# Patient Record
Sex: Female | Born: 1955
Health system: Southern US, Community
[De-identification: ages and names within clinical notes are randomized; demographics above are authoritative.]

## PROBLEM LIST (undated history)

## (undated) DIAGNOSIS — D699 Hemorrhagic condition, unspecified: Secondary | ICD-10-CM

## (undated) DIAGNOSIS — G5600 Carpal tunnel syndrome, unspecified upper limb: Secondary | ICD-10-CM

## (undated) DIAGNOSIS — N189 Chronic kidney disease, unspecified: Secondary | ICD-10-CM

## (undated) DIAGNOSIS — T7840XA Allergy, unspecified, initial encounter: Secondary | ICD-10-CM

## (undated) DIAGNOSIS — M1990 Unspecified osteoarthritis, unspecified site: Secondary | ICD-10-CM

## (undated) DIAGNOSIS — Z8669 Personal history of other diseases of the nervous system and sense organs: Secondary | ICD-10-CM

## (undated) DIAGNOSIS — R011 Cardiac murmur, unspecified: Secondary | ICD-10-CM

## (undated) DIAGNOSIS — N390 Urinary tract infection, site not specified: Secondary | ICD-10-CM

## (undated) DIAGNOSIS — I341 Nonrheumatic mitral (valve) prolapse: Secondary | ICD-10-CM

## (undated) DIAGNOSIS — G5601 Carpal tunnel syndrome, right upper limb: Principal | ICD-10-CM

## (undated) HISTORY — DX: Allergy, unspecified, initial encounter: T78.40XA

## (undated) HISTORY — DX: Carpal tunnel syndrome, right upper limb: G56.01

## (undated) HISTORY — DX: Carpal tunnel syndrome, unspecified upper limb: G56.00

## (undated) HISTORY — PX: COLONOSCOPY: SHX174

## (undated) HISTORY — DX: Cardiac murmur, unspecified: R01.1

## (undated) HISTORY — PX: EYE SURGERY: SHX253

## (undated) HISTORY — DX: Unspecified osteoarthritis, unspecified site: M19.90

## (undated) HISTORY — DX: Chronic kidney disease, unspecified: N18.9

---

## 1984-01-07 DIAGNOSIS — Z5189 Encounter for other specified aftercare: Secondary | ICD-10-CM

## 1984-01-07 HISTORY — DX: Encounter for other specified aftercare: Z51.89

## 1999-08-30 ENCOUNTER — Encounter: Admission: RE | Admit: 1999-08-30 | Discharge: 1999-08-30 | Payer: Self-pay | Admitting: Family Medicine

## 1999-08-30 ENCOUNTER — Encounter: Payer: Self-pay | Admitting: Family Medicine

## 1999-10-23 ENCOUNTER — Encounter: Admission: RE | Admit: 1999-10-23 | Discharge: 1999-10-23 | Payer: Self-pay | Admitting: Obstetrics and Gynecology

## 1999-10-23 ENCOUNTER — Encounter: Payer: Self-pay | Admitting: Obstetrics and Gynecology

## 2000-11-11 ENCOUNTER — Encounter: Admission: RE | Admit: 2000-11-11 | Discharge: 2000-11-11 | Payer: Self-pay | Admitting: Obstetrics and Gynecology

## 2000-11-11 ENCOUNTER — Encounter: Payer: Self-pay | Admitting: Obstetrics and Gynecology

## 2003-03-10 ENCOUNTER — Encounter: Admission: RE | Admit: 2003-03-10 | Discharge: 2003-03-10 | Payer: Self-pay | Admitting: Obstetrics and Gynecology

## 2005-09-05 ENCOUNTER — Ambulatory Visit: Payer: Self-pay | Admitting: Internal Medicine

## 2005-09-09 ENCOUNTER — Ambulatory Visit: Payer: Self-pay | Admitting: Internal Medicine

## 2005-10-09 ENCOUNTER — Ambulatory Visit: Payer: Self-pay | Admitting: Internal Medicine

## 2005-11-05 ENCOUNTER — Encounter: Admission: RE | Admit: 2005-11-05 | Discharge: 2005-11-05 | Payer: Self-pay | Admitting: Obstetrics and Gynecology

## 2007-09-10 ENCOUNTER — Encounter: Admission: RE | Admit: 2007-09-10 | Discharge: 2007-09-10 | Payer: Self-pay | Admitting: Obstetrics and Gynecology

## 2007-09-16 ENCOUNTER — Encounter: Admission: RE | Admit: 2007-09-16 | Discharge: 2007-09-16 | Payer: Self-pay | Admitting: Obstetrics and Gynecology

## 2010-01-28 ENCOUNTER — Encounter: Payer: Self-pay | Admitting: Obstetrics and Gynecology

## 2010-05-24 NOTE — Assessment & Plan Note (Signed)
[facility]                             PRIMARY CARE OFFICE NOTE   NAME:Heidi Mckee, Heidi Mckee                        MRN:          [id number]  DATE:09/09/2005                            DOB:          1955/09/08    CHIEF COMPLAINT:  New patient visit/routine physical.   HISTORY OF PRESENT ILLNESS:  The patient is a 55 year old white female here  to establish primary care.  She has not had a primary care physician in the  past.   Her past medical history is significant for migraine headache which were  more frequent when she was in her early 20's.  She infrequently gets  headaches currently.  She has a history of mitral valve prolapse.  She  previously took prophylactic antibiotics before dental procedures.  Her  mitral valve prolapse has been asymptomatic.   She has had no major surgeries, but did have cryosurgery (cryoablation of  her cervix secondary to pre-cancerous Pap smear).  This was performed in  1988.  She does follow up with her OB/GYN on a regular basis.  She had some  issues with stress reaction after her divorce 12 to 13 years ago, but was  never hospitalized and did not take any medications.   She does have a long history of tobacco use/abuse.  She has been smoking  since age 30 and currently smokes about half a pack a day.  She has tried to  quit in the past and is somewhat motivated to discontinue smoking.   She denies any history of heart disease or type 2 diabetes.  Currently she  is only taking birth control pills.  She denies any personal history of  blood clots.   FAMILY HISTORY:  Significant for stroke in her father.   PAST MEDICAL HISTORY (SUMMARY):  1. History of migraine headache, quiescent.  2. Mitral valve prolapse.  3. History of cryosurgery to her cervix for abnormal Pap in 1988.   CURRENT MEDICATIONS:  Mircette birth control, one a day.   ALLERGIES:  Allergies to medications include SULFA which causes hives.  The  patient has a strong family history of PENICILLIN ALLERGY and previous  physician never prescribed penicillin.   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   FAMILY HISTORY:  Mother is alive at age 74 and has hypertension.  Father is  deceased in his 24's secondary to complications from a stroke.  She denies  any family history of colon cancer or breast cancer.  She does not have any  siblings.   HABITS:  She seldom drinks.  Tobacco use is noted above.   PREVENTATIVE CARE HISTORY:  Her last mammogram was done in July 2006.  Her  last Pap was in May 2007.   REVIEW OF SYSTEMS:  No fevers, chills.  No HEENT symptoms.  She denies any  history of thyroid disorder.  She denies any chest pain or shortness of  breath.  The patient does not exercise  on a regular basis.  Occasional  heartburn once a week. No history of dysphagia, change in bowel habits,  constipation, or diarrhea.  No dysuria, frequency, urgency, and all other  systems negative.   PHYSICAL EXAMINATION:  VITAL SIGNS:  Weight 168 pounds, temperature 97.7  degrees, pulse 101, BP 141/85.  GENERAL:  In general, the patient is a well-developed, well-nourished 49-  year-old white female in no apparent distress.  HEENT:  Normocephalic, atraumatic.  Pupils equal, round, and reactive to  light bilaterally.  Extraocular muscles are intact.  The patient was  anicteric.  Conjunctivae was within normal limits.  Oropharyngeal was  unremarkable.  Auditory canals and tympanic membranes were clear  bilaterally.  Hearing was grossly normal.  NECK:  Supple.  No adenopathy, carotid bruits, or thyromegaly.  CHEST EXAM:  Normal respiratory effort.  Chest was clear to auscultation  bilaterally.  No rhonchi, rales, or wheezing.  CARDIOVASCULAR:  Regular rate and rhythm.  No significant murmurs, rubs, or  gallops appreciated.   ABDOMEN:  Soft, nontender.  Positive bowel sounds.  No organomegaly.  MUSCULOSKELETAL EXAM:  No clubbing, cyanosis, or edema.  SKIN:  Warm and dry.  PULSES:  The patient had intact pedis dorsalis pulses, equal and symmetric.  NEUROLOGIC:  Neurologically, cranial nerves II through XII grossly intact.  She was not focal, and her movement and affect were appropriate.   ASSESSMENT:  A routine physical on a 55 year old white female with a history  of tobacco abuse, and elevated blood pressure without a diagnosis of  hypertension.   RECOMMENDATIONS:  Strongly encourage smoking cessation.  She was given a  prescription for Chantix starter pack with two maintenance packs.  She is to  keep a log of her blood pressure readings until our followup visit in one  month.  She is to log at least 10 outpatient blood pressure readings.   I reviewed the her screening labs with the patient.  Cholesterol is within  normal limits.  Her thyroid, kidney, and liver function are all normal, as  well as her CBC.   Colonoscopy will be recommended at age 61, and she was encouraged to get a  repeat mammogram this year.   Follow up in one month time.                                   Barbette Hair. Artist Pais, DO   RDY/MedQ  DD:  09/09/2005  DT:  09/09/2005  Job #:  [id number]

## 2016-10-31 ENCOUNTER — Encounter: Payer: Self-pay | Admitting: Family Medicine

## 2017-04-06 ENCOUNTER — Encounter: Payer: Self-pay | Admitting: Family Medicine

## 2017-04-06 ENCOUNTER — Ambulatory Visit (INDEPENDENT_AMBULATORY_CARE_PROVIDER_SITE_OTHER): Payer: 59 | Admitting: Family Medicine

## 2017-04-06 VITALS — BP 180/110 | HR 84 | Temp 98.3°F | Resp 14 | Ht 65.25 in | Wt 194.0 lb

## 2017-04-06 DIAGNOSIS — Z7689 Persons encountering health services in other specified circumstances: Secondary | ICD-10-CM

## 2017-04-06 DIAGNOSIS — R030 Elevated blood-pressure reading, without diagnosis of hypertension: Secondary | ICD-10-CM

## 2017-04-06 DIAGNOSIS — Z1159 Encounter for screening for other viral diseases: Secondary | ICD-10-CM

## 2017-04-06 DIAGNOSIS — Z13220 Encounter for screening for lipoid disorders: Secondary | ICD-10-CM | POA: Diagnosis not present

## 2017-04-06 DIAGNOSIS — G5623 Lesion of ulnar nerve, bilateral upper limbs: Secondary | ICD-10-CM

## 2017-04-06 DIAGNOSIS — Z23 Encounter for immunization: Secondary | ICD-10-CM

## 2017-04-06 NOTE — Addendum Note (Signed)
Addended by: Legrand RamsWILLIS, SANDY B on: 04/06/2017 10:27 AM   Modules accepted: Orders

## 2017-04-06 NOTE — Progress Notes (Signed)
Subjective:     Patient ID: Heidi Mckee, female   DOB: [birth year], 62 y.o.   MRN: [id number]  HPI Patient is a 62 year old Caucasian female with no significant past medical history who presents today to establish care.  She does complain of burning pain in her left arm greater than her right arm.  Pain starts mainly at her elbow.  She has numbness in the fourth and fifth digits bilaterally.  She also complains of burning pain in her left shoulder at the [facility] joint.  She denies any pain with abduction.  There is no decreased range of motion.  She denies any specific injury.  Her mammogram was performed this month and is up-to-date.  Her colonoscopy is less than 15 years old.  She gets her Pap smear and her pelvic exam performed her gynecologist.  Past medical history is negative.  Past surgical history significant only for a C-section. No past medical history on file. No current outpatient medications on file prior to visit.   No current facility-administered medications on file prior to visit.    Allergies  Allergen Reactions  . Sulfa Antibiotics Anaphylaxis  . Penicillins     Unknown reaction - was told d/t reaction sulfa drugs   Social History   Socioeconomic History  . Marital status: Married    Spouse name: Not on file  . Number of children: Not on file  . Years of education: Not on file  . Highest education level: Not on file  Occupational History  . Not on file  Social Needs  . Financial resource strain: Not on file  . Food insecurity:    Worry: Not on file    Inability: Not on file  . Transportation needs:    Medical: Not on file    Non-medical: Not on file  Tobacco Use  . Smoking status: Former Smoker    Last attempt to quit: 07/07/2015    Years since quitting: 1.7  . Smokeless tobacco: Never Used  Substance and Sexual Activity  . Alcohol use: Not on file    Comment: Occasional  . Drug use: Never  . Sexual activity: Not on file  Lifestyle  . Physical activity:     Days per week: Not on file    Minutes per session: Not on file  . Stress: Not on file  Relationships  . Social connections:    Talks on phone: Not on file    Gets together: Not on file    Attends religious service: Not on file    Active member of club or organization: Not on file    Attends meetings of clubs or organizations: Not on file    Relationship status: Not on file  . Intimate partner violence:    Fear of current or ex partner: Not on file    Emotionally abused: Not on file    Physically abused: Not on file    Forced sexual activity: Not on file  Other Topics Concern  . Not on file  Social History Narrative  . Not on file   Family History  Problem Relation Age of Onset  . Dementia Mother   . Stroke Father   Mother died from vascular dementia.  She does have a family history of strokes.  Review of Systems  All other systems reviewed and are negative.      Objective:   Physical Exam  Constitutional: She is oriented to person, place, and time. She appears well-developed and well-nourished. No distress.  HENT:  Head: Normocephalic and atraumatic.  Right Ear: External ear normal.  Left Ear: External ear normal.  Nose: Nose normal.  Mouth/Throat: Oropharynx is clear and moist. No oropharyngeal exudate.  Eyes: Pupils are equal, round, and reactive to light. Conjunctivae and EOM are normal. Right eye exhibits no discharge. Left eye exhibits no discharge. No scleral icterus.  Neck: Normal range of motion. No JVD present. No tracheal deviation present. No thyromegaly present.  Cardiovascular: Normal rate, regular rhythm, normal heart sounds and intact distal pulses. Exam reveals no gallop and no friction rub.  No murmur heard. Pulmonary/Chest: Effort normal and breath sounds normal. No stridor. No respiratory distress. She has no wheezes. She has no rales. She exhibits no tenderness.  Abdominal: Soft. Bowel sounds are normal. She exhibits no distension and no mass. There  is no tenderness. There is no rebound and no guarding.  Musculoskeletal: Normal range of motion. She exhibits no edema, tenderness or deformity.  Lymphadenopathy:    She has no cervical adenopathy.  Neurological: She is alert and oriented to person, place, and time. She has normal reflexes. She displays normal reflexes. No cranial nerve deficit. She exhibits normal muscle tone. Coordination normal.  Skin: Skin is warm. No rash noted. She is not diaphoretic. No erythema. No pallor.  Psychiatric: She has a normal mood and affect. Her behavior is normal. Judgment and thought content normal.  Vitals reviewed.      Assessment:    Encounter to establish care with new doctor  Encounter for hepatitis C screening test for low risk patient - Plan: Hepatitis C Antibody  Screening cholesterol level - Plan: CBC with Differential/Platelet, COMPLETE METABOLIC PANEL WITH GFR, Lipid panel  Ulnar neuropathy of both upper extremities - Plan: Nerve conduction test  Single episode of elevated blood pressure - Plan: CBC with Differential/Platelet, COMPLETE METABOLIC PANEL WITH GFR, Lipid panel   Plan:     Patient's history is consistent with ulnar neuropathy.  I will schedule the patient for nerve conduction studies to evaluate for ulnar neuropathy.  If it confirms ulnar neuropathy, we can consult orthopedic surgery to discuss treatment options.  If there is no evidence of cervical radiculopathy, her exam today does not support rotator cuff pathology, therefore I would obtain an x-ray of her left shoulder to evaluate for what I suspect is AC joint arthritis.  Her blood pressure today is elevated.  I repeated it after she had relaxed and it dropped to 164/94.  She will check her blood pressure every day at home for 1 week and notify me of the values.  If persistently greater than 140/90, I would recommend medication such as hydrochlorothiazide.  Return fasting for a CBC, CMP, fasting lipid panel.  Patient request  to be screened for hepatitis C.  She declines HIV test.  The remainder of her cancer screening is up-to-date.  She received a tetanus shot today.  She also received the first dose of Shingrix.

## 2017-04-10 ENCOUNTER — Telehealth: Payer: Self-pay | Admitting: Family Medicine

## 2017-04-10 ENCOUNTER — Other Ambulatory Visit: Payer: Self-pay | Admitting: Family Medicine

## 2017-04-10 DIAGNOSIS — G5623 Lesion of ulnar nerve, bilateral upper limbs: Secondary | ICD-10-CM

## 2017-04-10 NOTE — Telephone Encounter (Signed)
absolutely

## 2017-04-10 NOTE — Telephone Encounter (Signed)
Called to get patient's nerve conduction scheduled. [facility] and [facility] prefer to have them in as referrals. May I put in a referral for the nerve conduction study.Please advise?

## 2017-04-13 NOTE — Telephone Encounter (Signed)
Referral ordered in epic 

## 2017-04-16 ENCOUNTER — Encounter: Payer: Self-pay | Admitting: Family Medicine

## 2017-05-12 ENCOUNTER — Encounter: Payer: Self-pay | Admitting: Neurology

## 2017-05-12 ENCOUNTER — Ambulatory Visit (INDEPENDENT_AMBULATORY_CARE_PROVIDER_SITE_OTHER): Payer: 59 | Admitting: Neurology

## 2017-05-12 DIAGNOSIS — G5601 Carpal tunnel syndrome, right upper limb: Secondary | ICD-10-CM

## 2017-05-12 HISTORY — DX: Carpal tunnel syndrome, right upper limb: G56.01

## 2017-05-12 NOTE — Progress Notes (Signed)
Please refer to EMG and nerve conduction study procedure note. 

## 2017-05-12 NOTE — Procedures (Signed)
     HISTORY:  Heidi Mckee is a 62 year old patient with a history of discomfort in both upper extremities, left greater than right, with left shoulder discomfort, and paresthesias involving the hands.  The symptoms are oftentimes worse in the evening hours.  She is being evaluated for a possible neuropathy or a cervical radiculopathy.  NERVE CONDUCTION STUDIES:  Nerve conduction studies were performed on both upper extremities.  The distal motor latencies for the median nerves were prolonged on the right, normal on the left, and normal for the ulnar nerves bilaterally.  The motor amplitudes for the median nerves were low on the right, normal on the left, and normal for the ulnar nerves bilaterally.  The nerve conduction velocities for the median and ulnar nerves were normal bilaterally.  The sensory latencies for the median nerves were prolonged bilaterally, normal for the ulnar nerves bilaterally.  The F-wave latencies for the ulnar nerves were within normal limits bilaterally.  EMG STUDIES:  EMG study was performed on the left upper extremity:  The first dorsal interosseous muscle reveals 2 to 4 K units with full recruitment. No fibrillations or positive waves were noted. The abductor pollicis brevis muscle reveals 2 to 4 K units with slightly reduced recruitment. No fibrillations or positive waves were noted. The extensor indicis proprius muscle reveals 1 to 3 K units with full recruitment. No fibrillations or positive waves were noted. The pronator teres muscle reveals 2 to 3 K units with full recruitment. No fibrillations or positive waves were noted. The biceps muscle reveals 1 to 2 K units with full recruitment. No fibrillations or positive waves were noted. The triceps muscle reveals 2 to 4 K units with full recruitment. No fibrillations or positive waves were noted. The anterior deltoid muscle reveals 2 to 3 K units with full recruitment. No fibrillations or positive waves were  noted. The cervical paraspinal muscles were tested at 2 levels. No abnormalities of insertional activity were seen at either level tested. There was poor relaxation.   IMPRESSION:  Nerve conduction studies done on both upper extremities shows evidence of a mild to moderate right carpal tunnel syndrome and evidence of a borderline left carpal tunnel syndrome.  No evidence for a left cervical radiculopathy by EMG study of the left arm.  Marlan Palau MD 05/12/2017 1:50 PM  [facility] [address] [place], [place] [address]  Phone [phone] Fax [phone]

## 2017-05-12 NOTE — Progress Notes (Signed)
Has mild carpal tunnel on right side but no significant nerve problem seen on left side.  Not sure of the cause of her burning pain in left arm.

## 2017-05-13 NOTE — Progress Notes (Signed)
LMTRC

## 2017-05-15 NOTE — Progress Notes (Signed)
LMTRC

## 2017-05-18 NOTE — Progress Notes (Signed)
Patient aware of results.

## 2017-05-25 ENCOUNTER — Other Ambulatory Visit: Payer: 59

## 2017-05-26 ENCOUNTER — Encounter: Payer: Self-pay | Admitting: *Deleted

## 2017-05-26 LAB — COMPLETE METABOLIC PANEL WITH GFR
AG Ratio: 1.3 (calc) (ref 1.0–2.5)
ALBUMIN MSPROF: 4.1 g/dL (ref 3.6–5.1)
ALKALINE PHOSPHATASE (APISO): 34 U/L (ref 33–130)
ALT: 16 U/L (ref 6–29)
AST: 18 U/L (ref 10–35)
BUN: 14 mg/dL (ref 7–25)
CO2: 25 mmol/L (ref 20–32)
CREATININE: 0.67 mg/dL (ref 0.50–0.99)
Calcium: 9.5 mg/dL (ref 8.6–10.4)
Chloride: 104 mmol/L (ref 98–110)
GFR, Est African American: 111 mL/min/{1.73_m2} (ref >=60)
GFR, Est Non African American: 96 mL/min/{1.73_m2} (ref >=60)
GLUCOSE: 97 mg/dL (ref 65–99)
Globulin: 3.1 g/dL (calc) (ref 1.9–3.7)
Potassium: 4.7 mmol/L (ref 3.5–5.3)
Sodium: 140 mmol/L (ref 135–146)
TOTAL PROTEIN: 7.2 g/dL (ref 6.1–8.1)
Total Bilirubin: 0.5 mg/dL (ref 0.2–1.2)

## 2017-05-26 LAB — CBC WITH DIFFERENTIAL/PLATELET
BASOS PCT: 0.9 %
Basophils Absolute: 49 cells/uL (ref 0–200)
EOS PCT: 1.5 %
Eosinophils Absolute: 81 cells/uL (ref 15–500)
HCT: 42.9 % (ref 35.0–45.0)
HEMOGLOBIN: 14.3 g/dL (ref 11.7–15.5)
Lymphs Abs: 1955 cells/uL (ref 850–3900)
MCH: 28.8 pg (ref 27.0–33.0)
MCHC: 33.3 g/dL (ref 32.0–36.0)
MCV: 86.5 fL (ref 80.0–100.0)
MONOS PCT: 10.7 %
MPV: 9.2 fL (ref 7.5–12.5)
NEUTROS ABS: 2738 {cells}/uL (ref 1500–7800)
Neutrophils Relative %: 50.7 %
Platelets: 325 10*3/uL (ref 140–400)
RBC: 4.96 10*6/uL (ref 3.80–5.10)
RDW: 12.9 % (ref 11.0–15.0)
Total Lymphocyte: 36.2 %
WBC mixed population: 578 cells/uL (ref 200–950)
WBC: 5.4 10*3/uL (ref 3.8–10.8)

## 2017-05-26 LAB — HEPATITIS C ANTIBODY
Hepatitis C Ab: NONREACTIVE
SIGNAL TO CUT-OFF: 0.02 (ref <1.00)

## 2017-05-26 LAB — LIPID PANEL
CHOLESTEROL: 225 mg/dL — AB (ref <200)
HDL: 76 mg/dL (ref >50)
LDL Cholesterol (Calc): 132 mg/dL (calc) — ABNORMAL HIGH
Non-HDL Cholesterol (Calc): 149 mg/dL (calc) — ABNORMAL HIGH (ref <130)
Total CHOL/HDL Ratio: 3 (calc) (ref <5.0)
Triglycerides: 80 mg/dL (ref <150)

## 2017-07-06 ENCOUNTER — Ambulatory Visit (INDEPENDENT_AMBULATORY_CARE_PROVIDER_SITE_OTHER): Payer: 59

## 2017-07-06 DIAGNOSIS — Z23 Encounter for immunization: Secondary | ICD-10-CM

## 2017-07-06 NOTE — Progress Notes (Signed)
Patient came in for 2nd dose of shingrix. Shingrix given in her right deltoid. Patient tolerated well. VIS was given

## 2019-05-09 ENCOUNTER — Encounter: Payer: Self-pay | Admitting: Family Medicine

## 2019-05-09 ENCOUNTER — Ambulatory Visit (INDEPENDENT_AMBULATORY_CARE_PROVIDER_SITE_OTHER): Payer: 59 | Admitting: Family Medicine

## 2019-05-09 ENCOUNTER — Other Ambulatory Visit: Payer: Self-pay

## 2019-05-09 VITALS — BP 172/96 | HR 80 | Temp 97.2°F | Resp 16 | Ht 65.0 in | Wt 202.0 lb

## 2019-05-09 DIAGNOSIS — S39012A Strain of muscle, fascia and tendon of lower back, initial encounter: Secondary | ICD-10-CM | POA: Diagnosis not present

## 2019-05-09 MED ORDER — MELOXICAM 15 MG PO TABS: 15.0000 mg | ORAL_TABLET | Freq: Every day | ORAL | 0 refills | Status: DC

## 2019-05-09 NOTE — Progress Notes (Signed)
Subjective:    Patient ID: Heidi Mckee, female    DOB: Dec 19, 1955, 64 y.o.   MRN: [id number]  HPI  Patient reports low back pain for more than a month.  The pain is located in the center of her back roughly at the level of L5.  She states that it is numb in that area.  She also reports numbness in both of her feet primarily the fourth and fifth toes bilaterally.  She also complains of weakness in both legs and "heaviness in both legs.  She denies any bowel or bladder incontinence.  She denies any saddle anesthesia.  She denies any radicular pain.  The pain staying centered in her lower back.  Is made worse with bending over and twisting motion.  She has a negative straight leg raise today.  Muscle strength is 5/5 equal and symmetric bilaterally.  She has normal reflexes at the patella as well as at the Achilles tendon.  She has no tenderness to palpation in the lumbar paraspinal muscles or spinous processes of the lumbar spine.  She denies any fevers or chills. Past Medical History:  Diagnosis Date  . Carpal tunnel syndrome of right wrist 05/12/2017   Past Surgical History:  Procedure Laterality Date  . CESAREAN SECTION     Current Outpatient Medications on File Prior to Visit  Medication Sig Dispense Refill  . Estradiol-Norethindrone Acet 0.5-0.1 MG tablet Take 1 tablet by mouth at bedtime.     No current facility-administered medications on file prior to visit.   Allergies  Allergen Reactions  . Sulfa Antibiotics Anaphylaxis  . Penicillins     Unknown reaction - was told d/t reaction sulfa drugs   Social History   Socioeconomic History  . Marital status: Married    Spouse name: Not on file  . Number of children: Not on file  . Years of education: Not on file  . Highest education level: Not on file  Occupational History  . Not on file  Tobacco Use  . Smoking status: Former Smoker    Quit date: 07/07/2015    Years since quitting: 3.8  . Smokeless tobacco: Never Used    Substance and Sexual Activity  . Alcohol use: Not on file    Comment: Occasional  . Drug use: Never  . Sexual activity: Not on file  Other Topics Concern  . Not on file  Social History Narrative  . Not on file   Social Determinants of Health   Financial Resource Strain:   . Difficulty of Paying Living Expenses:   Food Insecurity:   . Worried About Programme researcher, broadcasting/film/video in the Last Year:   . Barista in the Last Year:   Transportation Needs:   . Freight forwarder (Medical):   Marland Kitchen Lack of Transportation (Non-Medical):   Physical Activity:   . Days of Exercise per Week:   . Minutes of Exercise per Session:   Stress:   . Feeling of Stress :   Social Connections:   . Frequency of Communication with Friends and Family:   . Frequency of Social Gatherings with Friends and Family:   . Attends Religious Services:   . Active Member of Clubs or Organizations:   . Attends Banker Meetings:   Marland Kitchen Marital Status:   Intimate Partner Violence:   . Fear of Current or Ex-Partner:   . Emotionally Abused:   Marland Kitchen Physically Abused:   . Sexually Abused:  Review of Systems  All other systems reviewed and are negative.      Objective:   Physical Exam Vitals reviewed.  Constitutional:      Appearance: Normal appearance.  Cardiovascular:     Rate and Rhythm: Normal rate and regular rhythm.     Heart sounds: Normal heart sounds.  Pulmonary:     Effort: Pulmonary effort is normal.     Breath sounds: Normal breath sounds.  Musculoskeletal:     Lumbar back: No swelling, deformity, signs of trauma, lacerations, spasms, tenderness or bony tenderness. Decreased range of motion. Negative right straight leg raise test and negative left straight leg raise test.       Back:  Neurological:     Mental Status: She is alert.           Assessment & Plan:  Strain of lumbar region, initial encounter - Plan: DG Lumbar Spine Complete  I believe the numbness in the  fourth and fifth toes bilaterally is likely due to peripheral neuropathy.  I do not believe that this is sciatica.  Instead I believe the pain in her lower back is likely muscular although I cannot rule out degenerative disc disease.  Recommended an x-ray of the lumbar spine to evaluate further.  Meanwhile start meloxicam 15 mg daily as an anti-inflammatory both to treat muscle pain as well as potential inflammation secondary to spondylosis.

## 2019-05-10 ENCOUNTER — Ambulatory Visit
Admission: RE | Admit: 2019-05-10 | Discharge: 2019-05-10 | Disposition: A | Discharge status: Home / Self Care | Payer: 59 | Source: Ambulatory Visit | Attending: Family Medicine | Admitting: Family Medicine

## 2019-05-10 DIAGNOSIS — S39012A Strain of muscle, fascia and tendon of lower back, initial encounter: Secondary | ICD-10-CM

## 2019-05-20 ENCOUNTER — Encounter: Payer: Self-pay | Admitting: Family Medicine

## 2019-05-20 DIAGNOSIS — S39012A Strain of muscle, fascia and tendon of lower back, initial encounter: Secondary | ICD-10-CM

## 2019-06-03 ENCOUNTER — Encounter: Payer: Self-pay | Admitting: Physical Therapy

## 2019-06-03 ENCOUNTER — Other Ambulatory Visit: Payer: Self-pay

## 2019-06-03 ENCOUNTER — Ambulatory Visit: Payer: 59 | Attending: Family Medicine | Admitting: Physical Therapy

## 2019-06-03 DIAGNOSIS — G8929 Other chronic pain: Secondary | ICD-10-CM | POA: Diagnosis present

## 2019-06-03 DIAGNOSIS — M6281 Muscle weakness (generalized): Secondary | ICD-10-CM | POA: Insufficient documentation

## 2019-06-03 DIAGNOSIS — M545 Low back pain: Secondary | ICD-10-CM | POA: Insufficient documentation

## 2019-06-03 NOTE — Therapy (Signed)
[place], [place], [address] Phone: [phone]   Fax:  [phone]  Physical Therapy Evaluation  Patient Details  Name: Heidi Mckee MRN: [id number] Date of Birth: 1955-04-29 Referring Provider (PT): Jenna Luo, MD   Encounter Date: 06/03/2019  PT End of Session - 06/03/19 1200    Visit Number  1    Number of Visits  13    Date for PT Re-Evaluation  07/15/19    Authorization Type  [employer]- VL 30    PT Start Time  1150    PT Stop Time  1228    PT Time Calculation (min)  38 min    Activity Tolerance  Patient tolerated treatment well    Behavior During Therapy  [facility] for tasks assessed/performed       Past Medical History:  Diagnosis Date  . Carpal tunnel syndrome of right wrist 05/12/2017    Past Surgical History:  Procedure Laterality Date  . CESAREAN SECTION      There were no vitals filed for this visit.   Subjective Assessment - 06/03/19 1154    Subjective  When I am walking slowly, sleeping vaccuuming, mopping it hurts. Sometimes hard to get breath. If I can sit for a min or 2, I an carry on. Some days it doesn't bother me much. Feels like muscles are ringing like a wet rag. Gotten bad in the last couple of months. Neuropathy in both feet. I shredded my Lt hamstrings in college. About a year ago I slipped and did a split- stretching Lt hamstring. Slipped on stairs and slid down on one side and then a week later slipped on ice.    Patient Stated Goals  folding, bending, cleaning, work outside/garden    Currently in Pain?  Yes    Pain Score  3     Pain Location  Back    Pain Orientation  Lower;Right;Left   sometimes L is worse   Pain Descriptors / Indicators  Discomfort    Aggravating Factors   slight bend fwd- dishes, folding; walking slowly    Pain Relieving Factors  sit and rest         [facility] PT Assessment - 06/03/19 0001      Assessment   Medical Diagnosis  LBP    Referring  Provider (PT)  Jenna Luo, MD    Onset Date/Surgical Date  --   got bad a couple of months ago   Hand Dominance  Right      Precautions   Precautions  None      Restrictions   Weight Bearing Restrictions  No      Balance Screen   Has the patient fallen in the past 6 months  Yes    How many times?  2    Has the patient had a decrease in activity level because of a fear of falling?   Yes    Is the patient reluctant to leave their home because of a fear of falling?   No      Home Film/video editor residence    Additional Comments  stairs at home      Prior Function   Level of [place]  Retired      Associate Professor   Overall Cognitive Status  Within Functional Limits for tasks assessed      Observation/Other Assessments   Focus on Therapeutic Outcomes (FOTO)  44% limited      Sensation   Additional Comments  bil N/T in feet- neuropathy       ROM / Strength   AROM / PROM / Strength  AROM;Strength      AROM   AROM Assessment Site  Lumbar    Lumbar Flexion  --   to floor   Lumbar - Left Side Bend  --   pain on left side     Strength   Strength Assessment Site  Hip    Right/Left Hip  Right;Left    Right Hip Flexion  5/5    Right Hip ABduction  4/5    Left Hip Flexion  4+/5    Left Hip ABduction  4/5                  Objective measurements completed on examination: See above findings.      [facility] Adult PT Treatment/Exercise - 06/03/19 0001      Manual Therapy   Manual Therapy  Soft tissue mobilization    Soft tissue mobilization  Lt gluts             PT Education - 06/03/19 1318    Education Details  anatomy of condition POC, HEP, exercise form/rationale    Person(s) Educated  Patient    Methods  Explanation;Demonstration;Tactile cues;Verbal cues;Handout    Comprehension  Verbalized understanding;Returned demonstration;Verbal cues required;Tactile cues required;Need further instruction        PT Short Term Goals - 06/03/19 1315      PT SHORT TERM GOAL #1   Title  pt will be independent with short term HEP    Baseline  began establishing at eval    Time  3    Period  Weeks    Status  New    Target Date  06/24/19      PT SHORT TERM GOAL #2   Title  Pt will demo proper squat and hip hinge, unweighted    Baseline  will progress    Time  3    Period  Weeks    Status  New    Target Date  06/24/19        PT Long Term Goals - 06/03/19 1313      PT LONG TERM GOAL #1   Title  Pt will be able to bend to work in her garden    Baseline  unable due to pain at eval    Time  6    Period  Weeks    Status  New    Target Date  07/15/19      PT LONG TERM GOAL #2   Title  pt will be able to perform household chores pain <=2/10    Baseline  significant pain that makes it hard to catch her breath    Time  6    Period  Weeks    Status  New    Target Date  07/15/19      PT LONG TERM GOAL #3   Title  gross lumbopelvic strength to 5/5    Baseline  see flowsheet    Time  6    Period  Weeks    Status  New    Target Date  07/15/19      PT LONG TERM GOAL #4   Title  FOTO to 30% limited    Time  6    Period  Weeks    Status  New  Target Date  07/15/19             Plan - 06/03/19 1309    Clinical Impression Statement  Pt presents to PT with complaints of LBP that has been present on and off for years but recently worsened following multiple accidents including 2 falls down the stairs. Notable spasm and weakness around Lt hip compared to Rt. Did have a C-section with her daughter in which the incision crosses longitudinally across her lower abdomen. Will benefit from skilled PT to decrease spasm, improve lumbopelvic stability and reach functional goals.    Personal Factors and Comorbidities  Comorbidity 1;Time since onset of injury/illness/exacerbation    Comorbidities  h/o c-section    Examination-Activity Limitations  Lift;Stand;Locomotion  Level;Bend;Sit;Carry;Squat;Stairs    Examination-Participation Restrictions  Other;Church;Cleaning;Meal Prep;Shop    Stability/Clinical Decision Making  Stable/Uncomplicated    Clinical Decision Making  Low    Rehab Potential  Good    PT Frequency  2x / week    PT Duration  6 weeks    PT Treatment/Interventions  ADLs/Self Care Home Management;Cryotherapy;Electrical Stimulation;Ultrasound;Traction;Moist Heat;Iontophoresis 4mg /ml Dexamethasone;Gait training;Stair training;Functional mobility training;Therapeutic activities;Therapeutic exercise;Patient/family education;Neuromuscular re-education;Manual techniques;Taping;Dry needling;Passive range of motion;Spinal Manipulations;Joint Manipulations    PT Next Visit Plan  STM- consider DN, hip hinge, squat    PT Home Exercise Plan   PPT, transv abdominis engagement, hooklying leg extensions, piriformis stretch    Consulted and Agree with Plan of Care  Patient       Patient will benefit from skilled therapeutic intervention in order to improve the following deficits and impairments:  Pain, Improper body mechanics, Postural dysfunction, Increased muscle spasms, Decreased activity tolerance, Decreased strength, Difficulty walking  Visit Diagnosis: Chronic bilateral low back pain without sciatica - Plan: PT plan of care cert/re-cert  Muscle weakness (generalized) - Plan: PT plan of care cert/re-cert     Problem List Patient Active Problem List   Diagnosis Date Noted  . Carpal tunnel syndrome of right wrist 05/12/2017    Zakhi Dupre C. Abdullahi Vallone PT, DPT 06/03/19 1:20 PM   [facility] [address] [place], [place], [place] Phone: [phone]   Fax:  [phone]  Name: KYANNA MAHRT MRN: [name] Date of Birth: May 15, 1955

## 2019-06-08 ENCOUNTER — Ambulatory Visit: Payer: 59 | Attending: Family Medicine | Admitting: Physical Therapy

## 2019-06-08 ENCOUNTER — Encounter: Payer: Self-pay | Admitting: Physical Therapy

## 2019-06-08 ENCOUNTER — Other Ambulatory Visit: Payer: Self-pay

## 2019-06-08 DIAGNOSIS — G8929 Other chronic pain: Secondary | ICD-10-CM | POA: Diagnosis present

## 2019-06-08 DIAGNOSIS — M545 Low back pain, unspecified: Secondary | ICD-10-CM

## 2019-06-08 DIAGNOSIS — M6281 Muscle weakness (generalized): Secondary | ICD-10-CM

## 2019-06-08 NOTE — Patient Instructions (Signed)
Access Code: [email or web address] URL: [email or web address] Date: 06/08/2019 Prepared by: Rosana Hoes  Exercises Supine Figure 4 Piriformis Stretch - 2 x daily - 7 x weekly - 2 reps - 20s hold Supine Lower Trunk Rotation - 2 x daily - 7 x weekly - 10 reps - 5-10 seconds hold Supine Transversus Abdominis Bracing - Hands on Stomach - 2 x daily - 7 x weekly - 10 reps - 5 seconds hold Supine 90/90 Alternating Toe Touch One Leg at a Time - 2 x daily - 7 x weekly - 3 sets - 10 reps Bridge - 2 x daily - 7 x weekly - 2 sets - 10 reps - 2 seconds hold Cat-Camel - 2 x daily - 7 x weekly - 10 reps

## 2019-06-08 NOTE — Therapy (Signed)
[facility] [address] [place], [place], [address] Phone: [phone]   Fax:  [phone]  Physical Therapy Treatment  Patient Details  Name: Heidi Mckee MRN: [id number] Date of Birth: 04-Dec-1955 Referring Provider (PT): Lynnea Ferrier, MD   Encounter Date: 06/08/2019  PT End of Session - 06/08/19 1442    Visit Number  2    Number of Visits  13    Date for PT Re-Evaluation  07/15/19    Authorization Type  [employer]- VL 30    PT Start Time  1443    PT Stop Time  1523    PT Time Calculation (min)  40 min    Activity Tolerance  Patient tolerated treatment well    Behavior During Therapy  [facility] for tasks assessed/performed       Past Medical History:  Diagnosis Date  . Carpal tunnel syndrome of right wrist 05/12/2017    Past Surgical History:  Procedure Laterality Date  . CESAREAN SECTION      There were no vitals filed for this visit.  Subjective Assessment - 06/08/19 1441    Subjective  Patient reports low back is feel ok. The piriformis stretch feels good and she feels she has a little more range on the left side than she did before. She does continue to have pain with activity and needs to sit to rest or stretch to help relieve pain.    Patient Stated Goals  folding, bending, cleaning, work outside/garden    Currently in Pain?  Yes    Pain Score  2     Pain Location  Back    Pain Orientation  Lower    Pain Descriptors / Indicators  Tightness    Pain Type  Chronic pain    Pain Onset  More than a month ago    Pain Frequency  Intermittent         [facility] PT Assessment - 06/08/19 0001      Strength   Right Hip Extension  4/5    Left Hip Extension  4/5                    [facility] Adult PT Treatment/Exercise - 06/08/19 0001      Exercises   Exercises  Lumbar      Lumbar Exercises: Stretches   Lower Trunk Rotation Limitations  5 sec hold x10 in figure-4 position    Piriformis Stretch  2 reps;20 seconds       Lumbar Exercises: Aerobic   Nustep  L5 x 5 min with UE and LE      Lumbar Exercises: Supine   Pelvic Tilt  10 reps    Bridge  10 reps   2 sets   Other Supine Lumbar Exercises  Hooklying PPT with leg extension x10    Other Supine Lumbar Exercises  90-90 alternating toe taps 2x10      Lumbar Exercises: Quadruped   Madcat/Old Horse  10 reps             PT Education - 06/08/19 1442    Education Details  HEP    Person(s) Educated  Patient    Methods  Explanation;Demonstration;Verbal cues    Comprehension  Verbalized understanding;Returned demonstration;Verbal cues required;Need further instruction       PT Short Term Goals - 06/03/19 1315      PT SHORT TERM GOAL #1   Title  pt will be independent with short term HEP  Baseline  began establishing at eval    Time  3    Period  Weeks    Status  New    Target Date  06/24/19      PT SHORT TERM GOAL #2   Title  Pt will demo proper squat and hip hinge, unweighted    Baseline  will progress    Time  3    Period  Weeks    Status  New    Target Date  06/24/19        PT Long Term Goals - 06/03/19 1313      PT LONG TERM GOAL #1   Title  Pt will be able to bend to work in her garden    Baseline  unable due to pain at eval    Time  6    Period  Weeks    Status  New    Target Date  07/15/19      PT LONG TERM GOAL #2   Title  pt will be able to perform household chores pain <=2/10    Baseline  significant pain that makes it hard to catch her breath    Time  6    Period  Weeks    Status  New    Target Date  07/15/19      PT LONG TERM GOAL #3   Title  gross lumbopelvic strength to 5/5    Baseline  see flowsheet    Time  6    Period  Weeks    Status  New    Target Date  07/15/19      PT LONG TERM GOAL #4   Title  FOTO to 30% limited    Time  6    Period  Weeks    Status  New    Target Date  07/15/19            Plan - 06/08/19 1443    Clinical Impression Statement  Patient tolerated therapy  well with no adverse effects. Core and hip strengthening was progressed this visit with good tolerance. She did require cueing for proper technique and maintaining neutral lumbar spine through core engagement. She would benefit from continued skilled PT to progress moiton and stretch in order to reduce lower back pain with activity.    PT Treatment/Interventions  ADLs/Self Care Home Management;Cryotherapy;Electrical Stimulation;Ultrasound;Traction;Moist Heat;Iontophoresis 4mg /ml Dexamethasone;Gait training;Stair training;Functional mobility training;Therapeutic activities;Therapeutic exercise;Patient/family education;Neuromuscular re-education;Manual techniques;Taping;Dry needling;Passive range of motion;Spinal Manipulations;Joint Manipulations    PT Next Visit Plan  STM- consider DN, hip hinge, squat    PT Home Exercise Plan  [id number]:  piriformis stretch, figure-4 LTR, transv abdominis engagement, 90-90 heel taps, bridge, cat camel    Consulted and Agree with Plan of Care  Patient       Patient will benefit from skilled therapeutic intervention in order to improve the following deficits and impairments:  Pain, Improper body mechanics, Postural dysfunction, Increased muscle spasms, Decreased activity tolerance, Decreased strength, Difficulty walking  Visit Diagnosis: Chronic bilateral low back pain without sciatica  Muscle weakness (generalized)     Problem List Patient Active Problem List   Diagnosis Date Noted  . Carpal tunnel syndrome of right wrist 05/12/2017    Hilda Blades, PT, DPT, LAT, ATC 06/08/19  3:27 PM Phone: [phone] Fax: [facility] [address] [place], [place], [address] Phone: [phone]   Fax:  [phone]  Name: Heidi Mckee MRN: [id number] Date of  Birth: 02-Jun-1955

## 2019-06-16 ENCOUNTER — Encounter: Payer: Self-pay | Admitting: Family Medicine

## 2019-06-17 ENCOUNTER — Encounter: Payer: Self-pay | Admitting: Physical Therapy

## 2019-06-17 ENCOUNTER — Other Ambulatory Visit: Payer: Self-pay

## 2019-06-17 ENCOUNTER — Ambulatory Visit: Payer: 59 | Admitting: Physical Therapy

## 2019-06-17 DIAGNOSIS — M545 Low back pain: Secondary | ICD-10-CM | POA: Diagnosis not present

## 2019-06-17 DIAGNOSIS — G8929 Other chronic pain: Secondary | ICD-10-CM

## 2019-06-17 DIAGNOSIS — M6281 Muscle weakness (generalized): Secondary | ICD-10-CM

## 2019-06-17 NOTE — Patient Instructions (Signed)
Access Code: [email or web address] URL: [email or web address] Date: 06/17/2019 Prepared by: Rosana Hoes  Exercises Supine Figure 4 Piriformis Stretch - 2 x daily - 7 x weekly - 2 reps - 20s hold Supine Transversus Abdominis Bracing - Hands on Stomach - 2 x daily - 7 x weekly - 10 reps - 5 seconds hold Supine Lumbar Rotation Stretch - 2 x daily - 7 x weekly - 5 reps - 10 second hold hold Cat-Camel - 2 x daily - 7 x weekly - 10 reps Dead Bug with Ball - 1 x daily - 7 x weekly - 10 reps - 3 sets Bridge - 1 x daily - 7 x weekly - 3 sets - 10 reps - 2 seconds hold

## 2019-06-17 NOTE — Therapy (Signed)
[facility] [address] [place], [place], [address] Phone: [phone]   Fax:  [phone]  Physical Therapy Treatment  Patient Details  Name: Heidi Mckee MRN: [id number] Date of Birth: October 08, 1955 Referring Provider (PT): Lynnea Ferrier, MD   Encounter Date: 06/17/2019   PT End of Session - 06/17/19 1127    Visit Number 3    Number of Visits 13    Date for PT Re-Evaluation 07/15/19    Authorization Type U[employer]- VL 30    PT Start Time 1123    PT Stop Time 1202    PT Time Calculation (min) 39 min    Activity Tolerance Patient tolerated treatment well    Behavior During Therapy [facility] for tasks assessed/performed           Past Medical History:  Diagnosis Date   Carpal tunnel syndrome of right wrist 05/12/2017    Past Surgical History:  Procedure Laterality Date   CESAREAN SECTION      There were no vitals filed for this visit.   Subjective Assessment - 06/17/19 1201    Subjective Patient reports getting up and down from the floor has gotten a lot better and her flexibility on the left side is getting better.    Patient Stated Goals folding, bending, cleaning, work outside/garden    Currently in Pain? Yes    Pain Score 1     Pain Location Back    Pain Orientation Lower    Pain Descriptors / Indicators Tightness    Pain Type Chronic pain    Pain Onset More than a month ago    Pain Frequency Intermittent                             [facility] Adult PT Treatment/Exercise - 06/17/19 0001      Exercises   Exercises Lumbar      Lumbar Exercises: Stretches   Lower Trunk Rotation Limitations Supine lumbar rotation stretch x5 10 sec      Lumbar Exercises: Aerobic   Nustep L5 x 5 min with UE and LE      Lumbar Exercises: Standing   Other Standing Lumbar Exercises Dead lift 15# x10, 25# 2x10 using 6" step      Lumbar Exercises: Supine   Dead Bug 10 reps   3 sets   Dead Bug Limitations swiss ball  between hands and thighs    Bridge 10 reps   2 sets                 PT Education - 06/17/19 1127    Education Details HEP    Person(s) Educated Patient    Methods Explanation;Demonstration;Tactile cues;Verbal cues;Handout    Comprehension Verbalized understanding;Returned demonstration;Verbal cues required;Tactile cues required;Need further instruction            PT Short Term Goals - 06/03/19 1315      PT SHORT TERM GOAL #1   Title pt will be independent with short term HEP    Baseline began establishing at eval    Time 3    Period Weeks    Status New    Target Date 06/24/19      PT SHORT TERM GOAL #2   Title Pt will demo proper squat and hip hinge, unweighted    Baseline will progress    Time 3    Period Weeks    Status New    Target  Date 06/24/19             PT Long Term Goals - 06/03/19 1313      PT LONG TERM GOAL #1   Title Pt will be able to bend to work in her garden    Baseline unable due to pain at eval    Time 6    Period Weeks    Status New    Target Date 07/15/19      PT LONG TERM GOAL #2   Title pt will be able to perform household chores pain <=2/10    Baseline significant pain that makes it hard to catch her breath    Time 6    Period Weeks    Status New    Target Date 07/15/19      PT LONG TERM GOAL #3   Title gross lumbopelvic strength to 5/5    Baseline see flowsheet    Time 6    Period Weeks    Status New    Target Date 07/15/19      PT LONG TERM GOAL #4   Title FOTO to 30% limited    Time 6    Period Weeks    Status New    Target Date 07/15/19                 Plan - 06/17/19 1128    Clinical Impression Statement Patient tolerated therapy well with no adverse effects. Continued core and hip strengthening with good tolerance, and initiated lifting technique using hip hinge. She does require cueing for proper hip hinge technique and to keep core/gluteal engagement. She would benefit from continued skilled PT to  progress moiton and stretch in order to reduce lower back pain with activity.    PT Treatment/Interventions ADLs/Self Care Home Management;Cryotherapy;Electrical Stimulation;Ultrasound;Traction;Moist Heat;Iontophoresis 4mg /ml Dexamethasone;Gait training;Stair training;Functional mobility training;Therapeutic activities;Therapeutic exercise;Patient/family education;Neuromuscular re-education;Manual techniques;Taping;Dry needling;Passive range of motion;Spinal Manipulations;Joint Manipulations    PT Next Visit Plan Progress core/hip strenthening, hip hinge technique, squat, pallof press    PT Home Exercise Plan [email or web address]:  piriformis stretch, lumbar cross body stretch, transv abdominis engagement, dead bug with swiss ball, bridge, cat camel    Consulted and Agree with Plan of Care Patient           Patient will benefit from skilled therapeutic intervention in order to improve the following deficits and impairments:  Pain, Improper body mechanics, Postural dysfunction, Increased muscle spasms, Decreased activity tolerance, Decreased strength, Difficulty walking  Visit Diagnosis: Chronic bilateral low back pain without sciatica  Muscle weakness (generalized)     Problem List Patient Active Problem List   Diagnosis Date Noted   Carpal tunnel syndrome of right wrist 05/12/2017    Hilda Blades, PT, DPT, LAT, ATC 06/17/19  12:11 PM Phone: [phone] Fax: [facility] [address] [place], [place], [address] Phone: [phone]   Fax:  [phone]  Name: Heidi Mckee MRN: [id number] Date of Birth: 1955/03/25

## 2019-06-21 ENCOUNTER — Ambulatory Visit: Payer: 59 | Admitting: Physical Therapy

## 2019-06-21 ENCOUNTER — Encounter: Payer: Self-pay | Admitting: Physical Therapy

## 2019-06-21 ENCOUNTER — Other Ambulatory Visit: Payer: Self-pay

## 2019-06-21 DIAGNOSIS — G8929 Other chronic pain: Secondary | ICD-10-CM

## 2019-06-21 DIAGNOSIS — M545 Low back pain: Secondary | ICD-10-CM | POA: Diagnosis not present

## 2019-06-21 DIAGNOSIS — M6281 Muscle weakness (generalized): Secondary | ICD-10-CM

## 2019-06-21 NOTE — Therapy (Signed)
[place], [place], [address] Phone: [phone]   Fax:  [phone]  Physical Therapy Treatment  Patient Details  Name: Heidi Mckee MRN: [id number] Date of Birth: January 15, 1955 Referring Provider (PT): Jenna Luo, MD   Encounter Date: 06/21/2019   PT End of Session - 06/21/19 1538    Visit Number 4    Number of Visits 13    Date for PT Re-Evaluation 07/15/19    Authorization Type UHC- VL 30    PT Start Time 8101    PT Stop Time 1612    PT Time Calculation (min) 41 min    Activity Tolerance Patient tolerated treatment well    Behavior During Therapy [facility] for tasks assessed/performed           Past Medical History:  Diagnosis Date  . Carpal tunnel syndrome of right wrist 05/12/2017    Past Surgical History:  Procedure Laterality Date  . CESAREAN SECTION      There were no vitals filed for this visit.   Subjective Assessment - 06/21/19 1537    Subjective Patient reports she is doing well. She mowed her yard yesterday so was a little stiff this morning and then was standing for a while when visiting a friend and had some back pain.    Patient Stated Goals folding, bending, cleaning, work outside/garden    Currently in Pain? Yes    Pain Score 1     Pain Location Back    Pain Orientation Lower    Pain Descriptors / Indicators Tightness;Aching    Pain Type Chronic pain    Pain Onset More than a month ago    Pain Frequency Intermittent              [facility] PT Assessment - 06/21/19 0001      AROM   Overall AROM Comments Patient exhibits full AROM with no pain reported                         [facility] Adult PT Treatment/Exercise - 06/21/19 0001      Exercises   Exercises Lumbar      Lumbar Exercises: Stretches   Lower Trunk Rotation Limitations Supine lumbar rotation stretch x5 10 sec    Piriformis Stretch 2 reps;20 seconds      Lumbar Exercises: Aerobic   Nustep L5 x 5  min with UE and LE      Lumbar Exercises: Standing   Other Standing Lumbar Exercises Dead lift 25# 2x10, 30# x10 using 6" step    Other Standing Lumbar Exercises Pallof press 2x10 with 7# on freemotion       Lumbar Exercises: Supine   Dead Bug 10 reps   2 sets   Dead Bug Limitations swiss ball between hands and thighs    Bridge with March 10 reps   2 sets     Lumbar Exercises: Sidelying   Hip Abduction 10 reps   2 sets                 PT Education - 06/21/19 1537    Education Details HEP    Person(s) Educated Patient    Methods Explanation;Demonstration;Verbal cues    Comprehension Verbalized understanding;Verbal cues required;Returned demonstration;Need further instruction            PT Short Term Goals - 06/03/19 1315      PT SHORT TERM GOAL #1   Title pt  will be independent with short term HEP    Baseline began establishing at eval    Time 3    Period Weeks    Status New    Target Date 06/24/19      PT SHORT TERM GOAL #2   Title Pt will demo proper squat and hip hinge, unweighted    Baseline will progress    Time 3    Period Weeks    Status New    Target Date 06/24/19             PT Long Term Goals - 06/03/19 1313      PT LONG TERM GOAL #1   Title Pt will be able to bend to work in her garden    Baseline unable due to pain at eval    Time 6    Period Weeks    Status New    Target Date 07/15/19      PT LONG TERM GOAL #2   Title pt will be able to perform household chores pain <=2/10    Baseline significant pain that makes it hard to catch her breath    Time 6    Period Weeks    Status New    Target Date 07/15/19      PT LONG TERM GOAL #3   Title gross lumbopelvic strength to 5/5    Baseline see flowsheet    Time 6    Period Weeks    Status New    Target Date 07/15/19      PT LONG TERM GOAL #4   Title FOTO to 30% limited    Time 6    Period Weeks    Status New    Target Date 07/15/19                 Plan - 06/21/19  1538    Clinical Impression Statement Patient tolerated therapy well with no adverse effects. She is progressing well with her strengthening exercises and did not report any increased pain this visit. Continued progressing lifting with good tolerance, patient does require consistent cueing for hip hinge technique. She would benefit from continued skilled PT to progress moiton and stretch in order to reduce lower back pain with activity.    PT Treatment/Interventions ADLs/Self Care Home Management;Cryotherapy;Electrical Stimulation;Ultrasound;Traction;Moist Heat;Iontophoresis 4mg /ml Dexamethasone;Gait training;Stair training;Functional mobility training;Therapeutic activities;Therapeutic exercise;Patient/family education;Neuromuscular re-education;Manual techniques;Taping;Dry needling;Passive range of motion;Spinal Manipulations;Joint Manipulations    PT Next Visit Plan Progress core/hip strenthening, hip hinge technique, squat, pallof press    PT Home Exercise Plan :  piriformis stretch, lumbar cross body stretch, transv abdominis engagement, dead bug with swiss ball, bridge, cat camel    Consulted and Agree with Plan of Care Patient           Patient will benefit from skilled therapeutic intervention in order to improve the following deficits and impairments:  Pain, Improper body mechanics, Postural dysfunction, Increased muscle spasms, Decreased activity tolerance, Decreased strength, Difficulty walking  Visit Diagnosis: Chronic bilateral low back pain without sciatica  Muscle weakness (generalized)     Problem List Patient Active Problem List   Diagnosis Date Noted  . Carpal tunnel syndrome of right wrist 05/12/2017    07/12/2017, PT, DPT, LAT, ATC 06/21/19  4:12 PM Phone: [phone] Fax: [phone]   [facility] [address] [place], [place], [place] Phone: [phone]   Fax:  [phone]  Name: Heidi Mckee MRN: [name] Date of Birth:  08/01/1955   

## 2019-06-23 ENCOUNTER — Other Ambulatory Visit: Payer: Self-pay

## 2019-06-23 ENCOUNTER — Ambulatory Visit: Payer: 59 | Admitting: Physical Therapy

## 2019-06-23 ENCOUNTER — Encounter: Payer: Self-pay | Admitting: Physical Therapy

## 2019-06-23 DIAGNOSIS — M545 Low back pain, unspecified: Secondary | ICD-10-CM

## 2019-06-23 DIAGNOSIS — M6281 Muscle weakness (generalized): Secondary | ICD-10-CM

## 2019-06-23 DIAGNOSIS — G8929 Other chronic pain: Secondary | ICD-10-CM

## 2019-06-23 NOTE — Patient Instructions (Signed)
Access Code: [email or web address] URL: [email or web address] Date: 06/23/2019 Prepared by: Rosana Hoes  Exercises Supine Figure 4 Piriformis Stretch - 2 x daily - 7 x weekly - 2 reps - 20s hold Supine Transversus Abdominis Bracing - Hands on Stomach - 2 x daily - 7 x weekly - 10 reps - 5 seconds hold Supine Lumbar Rotation Stretch - 2 x daily - 7 x weekly - 5 reps - 10 second hold hold Cat-Camel - 2 x daily - 7 x weekly - 10 reps Dead Bug with Ball - 1 x daily - 7 x weekly - 10 reps - 3 sets Marching Bridge - 1 x daily - 7 x weekly - 3 sets - 10 reps Kettlebell Deadlift - 1 x daily - 7 x weekly - 3 sets - 10 reps

## 2019-06-23 NOTE — Therapy (Signed)
[place], [place], [address] Phone: [phone]   Fax:  [phone]  Physical Therapy Treatment  Patient Details  Name: Heidi Mckee MRN: [id number] Date of Birth: 04/30/1955 Referring Provider (PT): Jenna Luo, MD   Encounter Date: 06/23/2019   PT End of Session - 06/23/19 0913    Visit Number 5    Number of Visits 13    Date for PT Re-Evaluation 07/15/19    Authorization Type UHC- VL 30    PT Start Time 0915    PT Stop Time 0955    PT Time Calculation (min) 40 min    Activity Tolerance Patient tolerated treatment well    Behavior During Therapy [facility] for tasks assessed/performed           Past Medical History:  Diagnosis Date  . Carpal tunnel syndrome of right wrist 05/12/2017    Past Surgical History:  Procedure Laterality Date  . CESAREAN SECTION      There were no vitals filed for this visit.   Subjective Assessment - 06/23/19 0912    Subjective Patient reports she is a little stiff this morning. Her back did not bother her after last visit.    Patient Stated Goals folding, bending, cleaning, work outside/garden    Currently in Pain? Yes    Pain Score 1     Pain Location Back    Pain Orientation Lower    Pain Descriptors / Indicators Tightness    Pain Type Chronic pain    Pain Onset More than a month ago    Pain Frequency Intermittent              [facility] PT Assessment - 06/23/19 0001      Functional Tests   Functional tests Squat      Squat   Comments Patient exhibits proper technique using hip hinge and maintaing neutral lumbar spine                         [facility] Adult PT Treatment/Exercise - 06/23/19 0001      Lumbar Exercises: Aerobic   Recumbent Bike L3 x 5 min      Lumbar Exercises: Standing   Other Standing Lumbar Exercises Dead lift 30# 3x10 on 4" step      Lumbar Exercises: Supine   Bridge with March 10 reps   2 sets     Lumbar Exercises:  Sidelying   Hip Abduction 10 reps   2 sets   Other Sidelying Lumbar Exercises Side plank on knees 10 sec x3 each                  PT Education - 06/23/19 0912    Education Details HEP    Person(s) Educated Patient    Methods Explanation;Demonstration;Verbal cues    Comprehension Verbalized understanding;Returned demonstration;Verbal cues required;Need further instruction            PT Short Term Goals - 06/23/19 0931      PT SHORT TERM GOAL #1   Title pt will be independent with short term HEP    Time 3    Period Weeks    Status Achieved    Target Date 06/24/19      PT SHORT TERM GOAL #2   Title Pt will demo proper squat and hip hinge, unweighted    Time 3    Period Weeks    Status Achieved  Target Date 06/24/19             PT Long Term Goals - 06/03/19 1313      PT LONG TERM GOAL #1   Title Pt will be able to bend to work in her garden    Baseline unable due to pain at eval    Time 6    Period Weeks    Status New    Target Date 07/15/19      PT LONG TERM GOAL #2   Title pt will be able to perform household chores pain <=2/10    Baseline significant pain that makes it hard to catch her breath    Time 6    Period Weeks    Status New    Target Date 07/15/19      PT LONG TERM GOAL #3   Title gross lumbopelvic strength to 5/5    Baseline see flowsheet    Time 6    Period Weeks    Status New    Target Date 07/15/19      PT LONG TERM GOAL #4   Title FOTO to 30% limited    Time 6    Period Weeks    Status New    Target Date 07/15/19                 Plan - 06/23/19 0913    Clinical Impression Statement Patient tolerated therapy well with no adverse effects. Continued progression of lifting this visit with good tolerance. She does exhibit proper squat and hip hinge technique this visit without cueing. She would benefit from continued skilled PT to progress moiton and stretch in order to reduce lower back pain with activity.    PT  Treatment/Interventions ADLs/Self Care Home Management;Cryotherapy;Electrical Stimulation;Ultrasound;Traction;Moist Heat;Iontophoresis 4mg /ml Dexamethasone;Gait training;Stair training;Functional mobility training;Therapeutic activities;Therapeutic exercise;Patient/family education;Neuromuscular re-education;Manual techniques;Taping;Dry needling;Passive range of motion;Spinal Manipulations;Joint Manipulations    PT Next Visit Plan Progress core/hip strenthening, hip hinge technique, squat, pallof press    PT Home Exercise Plan :  piriformis stretch, lumbar cross body stretch, transv abdominis engagement, dead bug with swiss ball, bridge, cat camel    Consulted and Agree with Plan of Care Patient           Patient will benefit from skilled therapeutic intervention in order to improve the following deficits and impairments:  Pain, Improper body mechanics, Postural dysfunction, Increased muscle spasms, Decreased activity tolerance, Decreased strength, Difficulty walking  Visit Diagnosis: Chronic bilateral low back pain without sciatica  Muscle weakness (generalized)     Problem List Patient Active Problem List   Diagnosis Date Noted  . Carpal tunnel syndrome of right wrist 05/12/2017    07/12/2017, PT, DPT, LAT, ATC 06/23/19  9:56 AM Phone: [phone] Fax: [phone]   [facility] [address] [place], [place], [place] Phone: [phone]   Fax:  [phone]  Name: Heidi Mckee MRN: Heidi Mckee Date of Birth: 05/29/1955

## 2019-06-28 ENCOUNTER — Ambulatory Visit: Payer: 59 | Admitting: Physical Therapy

## 2019-07-01 ENCOUNTER — Other Ambulatory Visit: Payer: Self-pay

## 2019-07-01 ENCOUNTER — Ambulatory Visit: Payer: 59 | Admitting: Physical Therapy

## 2019-07-01 ENCOUNTER — Encounter: Payer: Self-pay | Admitting: Physical Therapy

## 2019-07-01 DIAGNOSIS — M6281 Muscle weakness (generalized): Secondary | ICD-10-CM

## 2019-07-01 DIAGNOSIS — G8929 Other chronic pain: Secondary | ICD-10-CM

## 2019-07-01 DIAGNOSIS — M545 Low back pain: Secondary | ICD-10-CM | POA: Diagnosis not present

## 2019-07-01 NOTE — Therapy (Signed)
[place], [place], [address] Phone: [phone]   Fax:  [phone]  Physical Therapy Treatment  Patient Details  Name: Heidi Mckee MRN: [id number] Date of Birth: 29-Jan-1955 Referring Provider (PT): Heidi Luo, MD   Encounter Date: 07/01/2019   PT End of Session - 07/01/19 0939    Visit Number 6    Number of Visits 13    Date for PT Re-Evaluation 07/15/19    Authorization Type UHC- VL 30    PT Start Time 0932    PT Stop Time 1012    PT Time Calculation (min) 40 min    Activity Tolerance Patient tolerated treatment well    Behavior During Therapy [facility] for tasks assessed/performed           Past Medical History:  Diagnosis Date  . Carpal tunnel syndrome of right wrist 05/12/2017    Past Surgical History:  Procedure Laterality Date  . CESAREAN SECTION      There were no vitals filed for this visit.   Subjective Assessment - 07/01/19 0934    Subjective Flexibility has gotten a lot better. Lt side of lower back still hurts the most. Able to used stretches to decrease pain. was on hands and knees washing bathroom floor on Monday and my knee popped out or something. it is still very sore and making my back sore because I am walking funny. Have been laying pine straw for the last 2 days.    Patient Stated Goals folding, bending, cleaning, work outside/garden    Currently in Pain? Yes    Pain Score 4     Pain Location Back    Pain Orientation Left    Pain Descriptors / Indicators Sore              [facility] PT Assessment - 07/01/19 0001      Observation/Other Assessments   Focus on Therapeutic Outcomes (FOTO)  35% limited      Observation/Other Assessments-Edema    Edema --   significant edema noted in Lt knee today                        [facility] Adult PT Treatment/Exercise - 07/01/19 0001      Lumbar Exercises: Stretches   Other Lumbar Stretch Exercise physioball roll  flexion & flx+SB      Lumbar Exercises: Seated   Other Seated Lumbar Exercises seated ab set with hip hinge & twisting      Manual Therapy   Manual therapy comments LAD LLE    Soft tissue mobilization Lt lumbar paraspinals & QL, Lt gastroc/soleus            Trigger Point Dry Needling - 07/01/19 0001    Consent Given? Yes    Education Handout Provided --   verbal education   Muscles Treated Back/Hip Lumbar multifidi;Gluteus maximus;Quadratus lumborum    Gluteus Maximus Response Palpable increased muscle length   Lt   Lumbar multifidi Response Palpable increased muscle length   lt   Quadratus Lumborum Response Palpable increased muscle length   lt                 PT Short Term Goals - 06/23/19 0931      PT SHORT TERM GOAL #1   Title pt will be independent with short term HEP    Time 3    Period Weeks    Status Achieved  Target Date 06/24/19      PT SHORT TERM GOAL #2   Title Pt will demo proper squat and hip hinge, unweighted    Time 3    Period Weeks    Status Achieved    Target Date 06/24/19             PT Long Term Goals - 06/03/19 1313      PT LONG TERM GOAL #1   Title Pt will be able to bend to work in her garden    Baseline unable due to pain at eval    Time 6    Period Weeks    Status New    Target Date 07/15/19      PT LONG TERM GOAL #2   Title pt will be able to perform household chores pain <=2/10    Baseline significant pain that makes it hard to catch her breath    Time 6    Period Weeks    Status New    Target Date 07/15/19      PT LONG TERM GOAL #3   Title gross lumbopelvic strength to 5/5    Baseline see flowsheet    Time 6    Period Weeks    Status New    Target Date 07/15/19      PT LONG TERM GOAL #4   Title FOTO to 30% limited    Time 6    Period Weeks    Status New    Target Date 07/15/19                 Plan - 07/01/19 1012    Clinical Impression Statement progressing in FOTO score as she has  verbalized. Was able to decrease knee and back pain with manual therapy and TPDN today. Asked that she wear a solid sleeve on knee for patellar stability as it is swollen. Will try using core in her bending activities and lifting her dog.    PT Treatment/Interventions ADLs/Self Care Home Management;Cryotherapy;Electrical Stimulation;Ultrasound;Traction;Moist Heat;Iontophoresis 4mg /ml Dexamethasone;Gait training;Stair training;Functional mobility training;Therapeutic activities;Therapeutic exercise;Patient/family education;Neuromuscular re-education;Manual techniques;Taping;Dry needling;Passive range of motion;Spinal Manipulations;Joint Manipulations    PT Next Visit Plan progress sidelying hip abd strengthening. cont lifting (has to lift 20lb dachsund), pallof press    PT Home Exercise Plan :  piriformis stretch, lumbar cross body stretch, transv abdominis engagement, dead bug with swiss ball, bridge, cat camel    Consulted and Agree with Plan of Care Patient           Patient will benefit from skilled therapeutic intervention in order to improve the following deficits and impairments:  Pain, Improper body mechanics, Postural dysfunction, Increased muscle spasms, Decreased activity tolerance, Decreased strength, Difficulty walking  Visit Diagnosis: Chronic bilateral low back pain without sciatica  Muscle weakness (generalized)     Problem List Patient Active Problem List   Diagnosis Date Noted  . Carpal tunnel syndrome of right wrist 05/12/2017   Mikiah Durall C. Bayle Calvo PT, DPT 07/01/19 10:15 AM   [facility] [address] [place], [place], [place] Phone: [phone]   Fax:  [phone]  Name: Heidi Mckee MRN: Heidi Mckee Date of Birth: 03-22-1955

## 2019-07-05 ENCOUNTER — Other Ambulatory Visit: Payer: Self-pay

## 2019-07-05 ENCOUNTER — Encounter: Payer: Self-pay | Admitting: Physical Therapy

## 2019-07-05 ENCOUNTER — Ambulatory Visit: Payer: 59 | Admitting: Physical Therapy

## 2019-07-05 DIAGNOSIS — M545 Low back pain: Secondary | ICD-10-CM | POA: Diagnosis not present

## 2019-07-05 DIAGNOSIS — G8929 Other chronic pain: Secondary | ICD-10-CM

## 2019-07-05 DIAGNOSIS — M6281 Muscle weakness (generalized): Secondary | ICD-10-CM

## 2019-07-05 NOTE — Therapy (Addendum)
[place], [place], [address] Phone: [phone]   Fax:  [phone]  Physical Therapy Treatment  Patient Details  Name: Heidi Mckee MRN: [id number] Date of Birth: [birth year] Referring Provider (PT): Jenna Luo, MD   Encounter Date: 07/05/2019   PT End of Session - 07/05/19 1015    Visit Number 7    Number of Visits 13    Date for PT Re-Evaluation 07/15/19    Authorization Type UHC- VL 30    PT Start Time 1010    PT Stop Time 1053    PT Time Calculation (min) 43 min    Activity Tolerance Patient tolerated treatment well    Behavior During Therapy [facility] for tasks assessed/performed           Past Medical History:  Diagnosis Date  . Carpal tunnel syndrome of right wrist 05/12/2017    Past Surgical History:  Procedure Laterality Date  . CESAREAN SECTION      There were no vitals filed for this visit.   Subjective Assessment - 07/05/19 1013    Subjective Swelling is better. Back still hurts some but less constant. Still tighter on Lt side. Folding clothes is irritatble. Tried to focus on using core and would stop to stretch.    Patient Stated Goals folding, bending, cleaning, work outside/garden    Currently in Pain? Yes    Pain Score 1     Pain Location Back    Pain Orientation Left;Lower    Pain Descriptors / Indicators Pressure                             [facility] Adult PT Treatment/Exercise - 07/05/19 0001      Lumbar Exercises: Standing   Side Lunge 20 reps    Other Standing Lumbar Exercises hip hinge to squat    Other Standing Lumbar Exercises SLS- attempted rotation but needs further static balance, tandem with trunk rotation      Lumbar Exercises: Supine   Dead Bug Limitations cues for core- slowed to alt lifts and TT to taps    Bridge Limitations focus on core activation & spinal dissociation      Manual Therapy   Manual Therapy Joint mobilization    Joint  Mobilization Lt lower quadrant of sacrum PA in seated & prone 2 min each    Soft tissue mobilization Lt piriformis                    PT Short Term Goals - 06/23/19 0931      PT SHORT TERM GOAL #1   Title pt will be independent with short term HEP    Time 3    Period Weeks    Status Achieved    Target Date 06/24/19      PT SHORT TERM GOAL #2   Title Pt will demo proper squat and hip hinge, unweighted    Time 3    Period Weeks    Status Achieved    Target Date 06/24/19             PT Long Term Goals - 06/03/19 1313      PT LONG TERM GOAL #1   Title Pt will be able to bend to work in her garden    Baseline unable due to pain at eval    Time 6    Period Weeks    Status  New    Target Date 07/15/19      PT LONG TERM GOAL #2   Title pt will be able to perform household chores pain <=2/10    Baseline significant pain that makes it hard to catch her breath    Time 6    Period Weeks    Status New    Target Date 07/15/19      PT LONG TERM GOAL #3   Title gross lumbopelvic strength to 5/5    Baseline see flowsheet    Time 6    Period Weeks    Status New    Target Date 07/15/19      PT LONG TERM GOAL #4   Title FOTO to 30% limited    Time 6    Period Weeks    Status New    Target Date 07/15/19                 Plan - 07/05/19 1055    Clinical Impression Statement Doing much better with knee and just feels very minimal discomfort in lower back on Lt side. Noted to be aggrivated by forward bendingmotions so those were addressed today. Cues required for core activation and placing weight into heels. Found that balance is not well controlled which can contribute. Has very high arches bilaterally.    PT Treatment/Interventions ADLs/Self Care Home Management;Cryotherapy;Electrical Stimulation;Ultrasound;Traction;Moist Heat;Iontophoresis 44m/ml Dexamethasone;Gait training;Stair training;Functional mobility training;Therapeutic activities;Therapeutic  exercise;Patient/family education;Neuromuscular re-education;Manual techniques;Taping;Dry needling;Passive range of motion;Spinal Manipulations;Joint Manipulations    PT Next Visit Plan will be returning from vacation- d/c v need to renew    PT Home Exercise Plan [id number]  piriformis stretch, lumbar cross body stretch, transv abdominis engagement, dead bug with swiss ball, bridge, cat camel, SLS & tandem with trunk rotation    Consulted and Agree with Plan of Care Patient           Patient will benefit from skilled therapeutic intervention in order to improve the following deficits and impairments:  Pain, Improper body mechanics, Postural dysfunction, Increased muscle spasms, Decreased activity tolerance, Decreased strength, Difficulty walking  Visit Diagnosis: Chronic bilateral low back pain without sciatica  Muscle weakness (generalized)     Problem List Patient Active Problem List   Diagnosis Date Noted  . Carpal tunnel syndrome of right wrist 05/12/2017    Heidi Mckee PT, DPT 07/05/19 10:58 AM   [facility] [address]Phone: [phone]  Fax:  [phone] Name: Heidi BRACKEENMRN: [id number] of Birth: 2Mar 20, 1957 PHYSICAL THERAPY DISCHARGE SUMMARY  Visits from Start of Care: 7  Current functional level related to goals / functional outcomes: See above   Remaining deficits: See above   Education / Equipment: Anatomy of condition, POC, HEP, exercise form/rationale  Plan: Patient agrees to discharge.  Patient goals were partially met. Patient is being discharged due to not returning since the last visit.  ?????     Heidi Mckee PT, DPT 07/28/19 9:52 AM

## 2019-07-15 ENCOUNTER — Encounter: Payer: Self-pay | Admitting: Family Medicine

## 2019-07-19 ENCOUNTER — Ambulatory Visit: Payer: 59 | Admitting: Physical Therapy

## 2019-07-20 ENCOUNTER — Other Ambulatory Visit: Payer: Self-pay

## 2019-07-20 DIAGNOSIS — Z20822 Contact with and (suspected) exposure to covid-19: Secondary | ICD-10-CM

## 2019-07-20 MED ORDER — AZITHROMYCIN 250 MG PO TABS: ORAL_TABLET | ORAL | 0 refills | Status: DC

## 2019-07-20 NOTE — Progress Notes (Signed)
She has 2 implants in her ja often when she gets sinus infection, pain settles there, for the past week she has had , sinus pressure, head congestion  cough productive.Had fever low grade 100F 2 days ago  She went to the beach over 4th of July, and symptoms started a couple days later  [facility] has not been vaccinated No Asthma/COPD No active tobacco use Cough at worse at night, feels post nasal drip No known sick contacts  She has been using robitussin DM/nyquil/ motrin/benadryl   allegra didn't help, so benadryl    Treat for sinusitis, COVID test pending  continue allergy meds, add nasal saline  Often gets sick after beach trips, pine pollen makes it worse as she has allergy to it quarantine until results return Go to ER if SOB

## 2019-07-20 NOTE — Telephone Encounter (Signed)
Pt called with Covid like symptoms, coughing, fever, can still taste and smell. She did just travel to the beach, and when she came back she began to feel sick. Appt has been made for her with the nurse to be Covid tested.

## 2019-07-20 NOTE — Addendum Note (Signed)
Addended by: Milinda Antis F on: 07/20/2019 03:00 PM   Modules accepted: Orders

## 2019-07-21 ENCOUNTER — Encounter: Payer: Self-pay | Admitting: Family Medicine

## 2019-07-21 LAB — SARS-COV-2 RNA,(COVID-19) QUALITATIVE NAAT: SARS CoV2 RNA: NOT DETECTED

## 2019-07-22 ENCOUNTER — Ambulatory Visit: Payer: 59 | Admitting: Physical Therapy

## 2019-07-29 ENCOUNTER — Encounter: Payer: Self-pay | Admitting: Family Medicine

## 2019-07-29 DIAGNOSIS — H93239 Hyperacusis, unspecified ear: Secondary | ICD-10-CM

## 2019-08-02 ENCOUNTER — Ambulatory Visit
Admission: EM | Admit: 2019-08-02 | Discharge: 2019-08-02 | Disposition: A | Discharge status: Home / Self Care | Payer: 59 | Attending: Emergency Medicine | Admitting: Emergency Medicine

## 2019-08-02 DIAGNOSIS — N390 Urinary tract infection, site not specified: Secondary | ICD-10-CM

## 2019-08-02 LAB — POCT URINALYSIS DIP (MANUAL ENTRY)
Glucose, UA: 250 mg/dL — AB
Nitrite, UA: POSITIVE — AB
Protein Ur, POC: >=300 mg/dL — AB
Spec Grav, UA: 1.01 (ref 1.010–1.025)
Urobilinogen, UA: 2 E.U./dL — AB
pH, UA: 5 (ref 5.0–8.0)

## 2019-08-02 MED ORDER — NITROFURANTOIN MONOHYD MACRO 100 MG PO CAPS: 100.0000 mg | ORAL_CAPSULE | Freq: Two times a day (BID) | ORAL | 0 refills | Status: DC

## 2019-08-02 NOTE — ED Triage Notes (Signed)
Patient c/o of dysuria since Saturday. Reports urgency, increased frequency, and feels like she is not completely emptying her bladder.  Denies: back pain  OTC: azo

## 2019-08-02 NOTE — ED Provider Notes (Signed)
Heidi Mckee    CSN: [id number] Arrival date & time: 08/02/19  1620      History   Chief Complaint Chief Complaint  Patient presents with   Dysuria    HPI Heidi Mckee Patient is a 64 y.o. female.   Patient presents with 4-day history of dysuria, frequency, and urgency.  She reports mild suprapubic tenderness today when urinating.  Treatment attempted with OTC Azo.  She denies fever, chills, back pain, vaginal discharge, pelvic pain, or other symptoms.  Patient was treated by her PCP on 07/20/2019 with Zithromax for a sinus infection.  The history is provided by the patient.    Past Medical History:  Diagnosis Date   Carpal tunnel syndrome of right wrist 05/12/2017    Patient Active Problem List   Diagnosis Date Noted   Carpal tunnel syndrome of right wrist 05/12/2017    Past Surgical History:  Procedure Laterality Date   CESAREAN SECTION      OB History   No obstetric history on file.      Home Medications    Prior to Admission medications   Medication Sig Start Date End Date Taking? Authorizing Provider  azithromycin (ZITHROMAX) 250 MG tablet Take 2 tablets x 1 day, then 1 tablet daily x 4 days 07/20/19   Salley Scarlet, MD  Estradiol-Norethindrone Acet 0.5-0.1 MG tablet Take 1 tablet by mouth at bedtime. 04/14/19   [provider]  meloxicam (MOBIC) 15 MG tablet Take 1 tablet (15 mg total) by mouth daily. Patient not taking: Reported on 06/03/2019 05/09/19   Donita Brooks, MD  nitrofurantoin, macrocrystal-monohydrate, (MACROBID) 100 MG capsule Take 1 capsule (100 mg total) by mouth 2 (two) times daily. 08/02/19   Mickie Bail, NP    Family History Family History  Problem Relation Age of Onset   Dementia Mother    Stroke Father     Social History Social History   Tobacco Use   Smoking status: Former Smoker    Quit date: 07/07/2015    Years since quitting: 4.0   Smokeless tobacco: Never Used  Substance Use Topics   Alcohol use:  Not on file    Comment: Occasional   Drug use: Never     Allergies   Sulfa antibiotics and Penicillins   Review of Systems Review of Systems  Constitutional: Negative for chills and fever.  HENT: Negative for ear pain and sore throat.   Eyes: Negative for pain and visual disturbance.  Respiratory: Negative for cough and shortness of breath.   Cardiovascular: Negative for chest pain and palpitations.  Gastrointestinal: Positive for abdominal pain. Negative for vomiting.  Genitourinary: Positive for dysuria, frequency and urgency. Negative for flank pain, hematuria, pelvic pain and vaginal discharge.  Musculoskeletal: Negative for arthralgias and back pain.  Skin: Negative for color change and rash.  Neurological: Negative for seizures and syncope.  All other systems reviewed and are negative.    Physical Exam Triage Vital Signs ED Triage Vitals [08/02/19 1622]  Enc Vitals Group     BP      Pulse      Resp      Temp      Temp src      SpO2      Weight      Height      Head Circumference      Peak Flow      Pain Score 4     Pain Loc  Pain Edu?      Excl. in GC?    No data found.  Updated Vital Signs BP 127/81    Pulse 81    Temp 99 F (37.2 Mckee)    Resp 16    SpO2 97%   Visual Acuity Right Eye Distance:   Left Eye Distance:   Bilateral Distance:    Right Eye Near:   Left Eye Near:    Bilateral Near:     Physical Exam Vitals and nursing note reviewed.  Constitutional:      General: She is not in acute distress.    Appearance: She is well-developed.  HENT:     Head: Normocephalic and atraumatic.     Mouth/Throat:     Mouth: Mucous membranes are moist.  Eyes:     Conjunctiva/sclera: Conjunctivae normal.  Cardiovascular:     Rate and Rhythm: Normal rate and regular rhythm.     Heart sounds: No murmur heard.   Pulmonary:     Effort: Pulmonary effort is normal. No respiratory distress.     Breath sounds: Normal breath sounds.  Abdominal:      Palpations: Abdomen is soft.     Tenderness: There is no abdominal tenderness. There is no right CVA tenderness, left CVA tenderness, guarding or rebound.  Musculoskeletal:     Cervical back: Neck supple.     Right lower leg: No edema.     Left lower leg: No edema.  Skin:    General: Skin is warm and dry.     Findings: No rash.  Neurological:     General: No focal deficit present.     Mental Status: She is alert and oriented to person, place, and time.     Gait: Gait normal.  Psychiatric:        Mood and Affect: Mood normal.        Behavior: Behavior normal.      UC Treatments / Results  Labs (all labs ordered are listed, but only abnormal results are displayed) Labs Reviewed  POCT URINALYSIS DIP (MANUAL ENTRY) - Abnormal; Notable for the following components:      Result Value   Color, UA orange (*)    Clarity, UA cloudy (*)    Glucose, UA =250 (*)    Bilirubin, UA small (*)    Ketones, POC UA trace (5) (*)    Blood, UA large (*)    Protein Ur, POC >=300 (*)    Urobilinogen, UA 2.0 (*)    Nitrite, UA Positive (*)    Leukocytes, UA Large (3+) (*)    All other components within normal limits  URINE CULTURE    EKG   Radiology No results found.  Procedures Procedures (including critical care time)  Medications Ordered in UC Medications - No data to display  Initial Impression / Assessment and Plan / UC Course  I have reviewed the triage vital signs and the nursing notes.  Pertinent labs & imaging results that were available during my care of the patient were reviewed by me and considered in my medical decision making (see chart for details).   UTI.  Urine culture pending.  Treating with Macrobid.  Instructed patient to follow-up with her PCP if her symptoms are not improving.  Patient agrees to plan of care.   Final Clinical Impressions(s) / UC Diagnoses   Final diagnoses:  Urinary tract infection without hematuria, site unspecified     Discharge  Instructions     Take the  antibiotic as directed.    Follow up with your primary care provider if your symptoms are not improving.       ED Prescriptions    Medication Sig Dispense Auth. Provider   nitrofurantoin, macrocrystal-monohydrate, (MACROBID) 100 MG capsule Take 1 capsule (100 mg total) by mouth 2 (two) times daily. 10 capsule Mickie Bail, NP     PDMP not reviewed this encounter.   Mickie Bail, NP 08/02/19 [phone]

## 2019-08-02 NOTE — Discharge Instructions (Signed)
Take the antibiotic as directed.  Follow up with your primary care provider if your symptoms are not improving.     

## 2019-08-05 LAB — URINE CULTURE: Culture: >=100000 — AB

## 2019-08-15 ENCOUNTER — Other Ambulatory Visit: Payer: Self-pay

## 2019-08-15 ENCOUNTER — Ambulatory Visit (INDEPENDENT_AMBULATORY_CARE_PROVIDER_SITE_OTHER): Payer: 59 | Admitting: Family Medicine

## 2019-08-15 ENCOUNTER — Encounter: Payer: Self-pay | Admitting: Family Medicine

## 2019-08-15 VITALS — BP 142/84 | HR 85 | Temp 97.5°F | Ht 65.0 in | Wt 202.0 lb

## 2019-08-15 DIAGNOSIS — G5601 Carpal tunnel syndrome, right upper limb: Secondary | ICD-10-CM | POA: Diagnosis not present

## 2019-08-15 DIAGNOSIS — R300 Dysuria: Secondary | ICD-10-CM

## 2019-08-15 DIAGNOSIS — Z0000 Encounter for general adult medical examination without abnormal findings: Secondary | ICD-10-CM

## 2019-08-15 DIAGNOSIS — Z0001 Encounter for general adult medical examination with abnormal findings: Secondary | ICD-10-CM | POA: Diagnosis not present

## 2019-08-15 DIAGNOSIS — Z780 Asymptomatic menopausal state: Secondary | ICD-10-CM | POA: Diagnosis not present

## 2019-08-15 LAB — URINALYSIS, ROUTINE W REFLEX MICROSCOPIC
Bilirubin Urine: NEGATIVE
Glucose, UA: NEGATIVE
Hyaline Cast: NONE SEEN /LPF
Ketones, ur: NEGATIVE
Leukocytes,Ua: NEGATIVE
Nitrite: NEGATIVE
Protein, ur: NEGATIVE
Specific Gravity, Urine: 1.01 (ref 1.001–1.03)
pH: 6.5 (ref 5.0–8.0)

## 2019-08-15 LAB — MICROSCOPIC MESSAGE

## 2019-08-15 NOTE — Progress Notes (Signed)
Subjective:    Patient ID: Heidi Mckee, female    DOB: 20-May-1955, 64 y.o.   MRN: [id number]  HPI Patient was originally scheduled to follow-up her urinalysis.  She was seen in urgent care and diagnosed with an E. coli UTI that was sensitive to Macrobid.  She completed a 1 week trial of Macrobid and her symptoms have completely subsided.  Urinalysis only shows trace hematuria today and she is asymptomatic.  However she states that she wants complete physical exam.  She sees a gynecologist who performs her mammograms, breast exam, and Pap smear.  Her colonoscopy is overdue.  She is received communication from the gastroenterologist yet she has failed to schedule this up to now.  She has not received her Covid vaccine.  The remainder of her immunizations are listed below: Immunization History  Administered Date(s) Administered  . Tdap 04/06/2017  . Zoster Recombinat (Shingrix) 04/06/2017, 07/06/2017   She is due for a bone density test. She also complains of pain in both hands, both elbows, and both shoulders.  The pain will wake her up at night.  Will radiate from both hands into her elbows.  She also complains of numbness and tingling in both hands and burning pain in both hands.  However she also has pain in both shoulders with abduction greater than 90 degrees.  The right arm is worse than the left arm.   Past Medical History:  Diagnosis Date  . Carpal tunnel syndrome of right wrist 05/12/2017   Past Surgical History:  Procedure Laterality Date  . CESAREAN SECTION     Current Outpatient Medications on File Prior to Visit  Medication Sig Dispense Refill  . Estradiol-Norethindrone Acet 0.5-0.1 MG tablet Take 1 tablet by mouth at bedtime.    Marland Kitchen OVER THE COUNTER MEDICATION Vit d3 one daily Ginger root biotin     No current facility-administered medications on file prior to visit.    Allergies  Allergen Reactions  . Sulfa Antibiotics Anaphylaxis  . Penicillins     Unknown reaction  - was told d/t reaction sulfa drugs   Social History   Socioeconomic History  . Marital status: Married    Spouse name: Not on file  . Number of children: Not on file  . Years of education: Not on file  . Highest education level: Not on file  Occupational History  . Not on file  Tobacco Use  . Smoking status: Former Smoker    Quit date: 07/07/2015    Years since quitting: 4.1  . Smokeless tobacco: Never Used  Substance and Sexual Activity  . Alcohol use: Not on file    Comment: Occasional  . Drug use: Never  . Sexual activity: Not on file  Other Topics Concern  . Not on file  Social History Narrative  . Not on file   Social Determinants of Health   Financial Resource Strain:   . Difficulty of Paying Living Expenses:   Food Insecurity:   . Worried About Programme researcher, broadcasting/film/video in the Last Year:   . Barista in the Last Year:   Transportation Needs:   . Freight forwarder (Medical):   Marland Kitchen Lack of Transportation (Non-Medical):   Physical Activity:   . Days of Exercise per Week:   . Minutes of Exercise per Session:   Stress:   . Feeling of Stress :   Social Connections:   . Frequency of Communication with Friends and Family:   . Frequency  of Social Gatherings with Friends and Family:   . Attends Religious Services:   . Active Member of Clubs or Organizations:   . Attends Banker Meetings:   Marland Kitchen Marital Status:   Intimate Partner Violence:   . Fear of Current or Ex-Partner:   . Emotionally Abused:   Marland Kitchen Physically Abused:   . Sexually Abused:    Family History  Problem Relation Age of Onset  . Dementia Mother   . Stroke Father      Review of Systems  All other systems reviewed and are negative.      Objective:   Physical Exam Vitals reviewed.  Constitutional:      General: She is not in acute distress.    Appearance: Normal appearance. She is normal weight. She is not ill-appearing, toxic-appearing or diaphoretic.  HENT:     Head:  Normocephalic and atraumatic.     Right Ear: Tympanic membrane and ear canal normal. There is no impacted cerumen.     Left Ear: Tympanic membrane and ear canal normal. There is no impacted cerumen.     Nose: Nose normal. No congestion or rhinorrhea.     Mouth/Throat:     Mouth: Mucous membranes are moist.     Pharynx: Oropharynx is clear. No oropharyngeal exudate or posterior oropharyngeal erythema.  Eyes:     General: No scleral icterus.    Extraocular Movements: Extraocular movements intact.     Conjunctiva/sclera: Conjunctivae normal.     Pupils: Pupils are equal, round, and reactive to light.  Neck:     Vascular: No carotid bruit.  Cardiovascular:     Rate and Rhythm: Normal rate and regular rhythm.     Pulses: Normal pulses.     Heart sounds: Normal heart sounds. No murmur heard.  No friction rub. No gallop.   Pulmonary:     Effort: Pulmonary effort is normal. No respiratory distress.     Breath sounds: Normal breath sounds. No stridor. No wheezing, rhonchi or rales.  Chest:     Chest wall: No tenderness.  Abdominal:     General: Bowel sounds are normal. There is no distension.     Palpations: Abdomen is soft. There is no mass.     Tenderness: There is no abdominal tenderness. There is no right CVA tenderness, left CVA tenderness, guarding or rebound.     Hernia: No hernia is present.  Musculoskeletal:     Cervical back: Normal range of motion and neck supple. No rigidity.     Right lower leg: No edema.     Left lower leg: No edema.  Lymphadenopathy:     Cervical: No cervical adenopathy.  Skin:    Coloration: Skin is not jaundiced or pale.     Findings: No bruising, erythema, lesion or rash.  Neurological:     General: No focal deficit present.     Mental Status: She is alert and oriented to person, place, and time. Mental status is at baseline.     Cranial Nerves: No cranial nerve deficit.     Sensory: No sensory deficit.     Motor: No weakness.     Coordination:  Coordination normal.     Gait: Gait normal.     Deep Tendon Reflexes: Reflexes normal.  Psychiatric:        Mood and Affect: Mood normal.        Behavior: Behavior normal.        Thought Content: Thought content normal.  Judgment: Judgment normal.           Assessment & Plan:  Dysuria - Plan: POCT urinalysis dipstick, Urinalysis, Routine w reflex microscopic  General medical exam - Plan: CBC with Differential/Platelet, COMPLETE METABOLIC PANEL WITH GFR, Lipid panel  Carpal tunnel syndrome of right wrist  Postmenopausal estrogen deficiency - Plan: DG Bone Density  Urinalysis shows resolution of her urinary tract infection.  Recommended she schedule a colonoscopy.  Pap smear and mammogram were performed at her gynecologist.  Schedule the patient for a bone density test to screen for osteoporosis.  Strongly strongly strongly recommended the Covid vaccine.  Shingles vaccine is up-to-date.  Check CBC, CMP, fasting lipid panel.  I believe she has carpal tunnel in the right greater than the left wrist.  This may even be causing some of her pain that is radiating up the right forearm into the right elbow.  I recommended returning at her convenience for a cortisone injection into the carpal tunnel to see how much of her symptoms improve.  Also consider possible bursitis in her right shoulder.  Also on the differential diagnosis would be potential cervical radiculopathy however I favor bursitis coupled with carpal tunnel syndrome

## 2019-08-16 LAB — CBC WITH DIFFERENTIAL/PLATELET
Absolute Monocytes: 507 cells/uL (ref 200–950)
Basophils Absolute: 47 cells/uL (ref 0–200)
Basophils Relative: 0.8 %
Eosinophils Absolute: 71 cells/uL (ref 15–500)
Eosinophils Relative: 1.2 %
HCT: 42.9 % (ref 35.0–45.0)
Hemoglobin: 14.2 g/dL (ref 11.7–15.5)
Lymphs Abs: 1103 cells/uL (ref 850–3900)
MCH: 29.6 pg (ref 27.0–33.0)
MCHC: 33.1 g/dL (ref 32.0–36.0)
MCV: 89.4 fL (ref 80.0–100.0)
MPV: 9.5 fL (ref 7.5–12.5)
Monocytes Relative: 8.6 %
Neutro Abs: 4171 cells/uL (ref 1500–7800)
Neutrophils Relative %: 70.7 %
Platelets: 341 10*3/uL (ref 140–400)
RBC: 4.8 10*6/uL (ref 3.80–5.10)
RDW: 12.9 % (ref 11.0–15.0)
Total Lymphocyte: 18.7 %
WBC: 5.9 10*3/uL (ref 3.8–10.8)

## 2019-08-16 LAB — COMPLETE METABOLIC PANEL WITH GFR
AG Ratio: 1.4 (calc) (ref 1.0–2.5)
ALT: 12 U/L (ref 6–29)
AST: 13 U/L (ref 10–35)
Albumin: 4 g/dL (ref 3.6–5.1)
Alkaline phosphatase (APISO): 27 U/L — ABNORMAL LOW (ref 37–153)
BUN: 8 mg/dL (ref 7–25)
CO2: 25 mmol/L (ref 20–32)
Calcium: 9.2 mg/dL (ref 8.6–10.4)
Chloride: 103 mmol/L (ref 98–110)
Creat: 0.66 mg/dL (ref 0.50–0.99)
GFR, Est African American: 110 mL/min/{1.73_m2} (ref >=60)
GFR, Est Non African American: 95 mL/min/{1.73_m2} (ref >=60)
Globulin: 2.8 g/dL (calc) (ref 1.9–3.7)
Glucose, Bld: 86 mg/dL (ref 65–99)
Potassium: 4.6 mmol/L (ref 3.5–5.3)
Sodium: 138 mmol/L (ref 135–146)
Total Bilirubin: 0.4 mg/dL (ref 0.2–1.2)
Total Protein: 6.8 g/dL (ref 6.1–8.1)

## 2019-08-16 LAB — LIPID PANEL
Cholesterol: 197 mg/dL (ref <200)
HDL: 57 mg/dL (ref >=50)
LDL Cholesterol (Calc): 120 mg/dL (calc) — ABNORMAL HIGH
Non-HDL Cholesterol (Calc): 140 mg/dL (calc) — ABNORMAL HIGH (ref <130)
Total CHOL/HDL Ratio: 3.5 (calc) (ref <5.0)
Triglycerides: 97 mg/dL (ref <150)

## 2019-08-17 ENCOUNTER — Encounter: Payer: Self-pay | Admitting: Family Medicine

## 2019-08-17 ENCOUNTER — Ambulatory Visit: Payer: 59 | Attending: Family Medicine | Admitting: Audiologist

## 2019-08-17 ENCOUNTER — Other Ambulatory Visit: Payer: Self-pay

## 2019-08-17 DIAGNOSIS — H903 Sensorineural hearing loss, bilateral: Secondary | ICD-10-CM | POA: Insufficient documentation

## 2019-08-17 NOTE — Procedures (Signed)
  [facility] [address] [place], [place]  [address] [phone]  AUDIOLOGICAL  EVALUATION  NAME: Heidi Mckee     DOB:   15-Sep-1955      MRN: [id number]                                                                                     DATE: 08/17/2019     REFERENT: Donita Brooks, MD STATUS: Outpatient DIAGNOSIS: Sensory hearing loss, bilateral  History: [name] was seen for an audiological evaluation. [name] had a sudden onset sensorineural hearing loss in the right ear in October of 2015. She was seen by an ENT at [facility], Dr. Jenne Pane, and treated with Prednisone. The hearing recovered. Hearing at her last evaluation was described as: Pure tone audiometry reveals symmetric low frequency hearing in both ears that slopes down in the middle frequencies to moderate high frequency sensorineural loss that slopes back up in the highest frequencies. Speech discrimination is 92% in the left ear and 84% in the right ear. Hearing testing results were reviewed and demonstrate full resolution of the right-sided asymmetric hearing loss. Dr. Jenne Pane cleared [name] for hearing aids at that time.  Today, [name] has difficulty hearing in background noise, crowds, in the car, and when people are at a distance. This difficulty has gotten worse gradually. No pain or pressure reported in either ear. Intermittent tinnitus present in both ears. No other relevant case history reported.    Evaluation:   Otoscopy showed a clear view of the tympanic membranes, bilaterally  Tympanometry results were consistent with normal middle ear function in right ear, could not obtain seal for left ear testing   Audiometric testing was completed using conventional audiometry with insert transducer. Speech Recognition Thresholds were consistent with pure tone averages. Word Recognition was fair at conversation level and improved to excellent an elevated level. Pure tone thresholds show  normal hearing sloping to sensorineural hearing loss in both ears. Test results are consistent with slight asymmetry at 1.5k and 2k Hz.   Results:  The test results were reviewed with [name]. She is an excellent candidate for hearing aids in both ears due to improved speech understanding at elevated levels. She is also experiencing significant difficulty hearing on a daily basis. The nature and degree of her hearing loss was explained. [name] agreed she was ready for hearing aids. She was provided with copies of the audiogram, a list of communication strategies, and a list of local audiologists who dispense hearing aids.   Recommendations:  1. Amplification is necessary for both ears. Hearing aids can be purchased from a variety of locations. See provided list for locations in the [place] area.    [name]  Audiologist, Au.D., CCC-A 08/17/2019  12:54 PM  Cc: Donita Brooks, MD

## 2019-08-18 ENCOUNTER — Ambulatory Visit (INDEPENDENT_AMBULATORY_CARE_PROVIDER_SITE_OTHER): Payer: 59 | Admitting: Family Medicine

## 2019-08-18 ENCOUNTER — Encounter: Payer: Self-pay | Admitting: Family Medicine

## 2019-08-18 VITALS — BP 114/70 | HR 67 | Temp 98.3°F | Ht 65.0 in | Wt 202.0 lb

## 2019-08-18 DIAGNOSIS — Z1211 Encounter for screening for malignant neoplasm of colon: Secondary | ICD-10-CM

## 2019-08-18 DIAGNOSIS — G5601 Carpal tunnel syndrome, right upper limb: Secondary | ICD-10-CM

## 2019-08-18 NOTE — Progress Notes (Signed)
Subjective:    Patient ID: Heidi Mckee, female    DOB: 1955/07/21, 64 y.o.   MRN: [id number]  08/15/19 Patient was originally scheduled to follow-up her urinalysis.  She was seen in urgent care and diagnosed with an E. coli UTI that was sensitive to Macrobid.  She completed a 1 week trial of Macrobid and her symptoms have completely subsided.  Urinalysis only shows trace hematuria today and she is asymptomatic.  However she states that she wants complete physical exam.  She sees a gynecologist who performs her mammograms, breast exam, and Pap smear.  Her colonoscopy is overdue.  She is received communication from the gastroenterologist yet she has failed to schedule this up to now.  She has not received her Covid vaccine.  The remainder of her immunizations are listed below: Immunization History  Administered Date(s) Administered  . Tdap 04/06/2017  . Zoster Recombinat (Shingrix) 04/06/2017, 07/06/2017   She is due for a bone density test. She also complains of pain in both hands, both elbows, and both shoulders.  The pain will wake her up at night.  Will radiate from both hands into her elbows.  She also complains of numbness and tingling in both hands and burning pain in both hands.  However she also has pain in both shoulders with abduction greater than 90 degrees.  The right arm is worse than the left arm.  At that time, my plan was: Urinalysis shows resolution of her urinary tract infection.  Recommended she schedule a colonoscopy.  Pap smear and mammogram were performed at her gynecologist.  Schedule the patient for a bone density test to screen for osteoporosis.  Strongly strongly strongly recommended the Covid vaccine.  Shingles vaccine is up-to-date.  Check CBC, CMP, fasting lipid panel.  I believe she has carpal tunnel in the right greater than the left wrist.  This may even be causing some of her pain that is radiating up the right forearm into the right elbow.  I recommended returning at  her convenience for a cortisone injection into the carpal tunnel to see how much of her symptoms improve.  Also consider possible bursitis in her right shoulder.  Also on the differential diagnosis would be potential cervical radiculopathy however I favor bursitis coupled with carpal tunnel syndrome  08/18/19 Patient is here today to discuss carpal tunnel syndrome.  She is requesting a cortisone injection.  She has scheduled a Covid vaccine.  I congratulated her on doing that.   Past Medical History:  Diagnosis Date  . Carpal tunnel syndrome of right wrist 05/12/2017   Past Surgical History:  Procedure Laterality Date  . CESAREAN SECTION     Current Outpatient Medications on File Prior to Visit  Medication Sig Dispense Refill  . Estradiol-Norethindrone Acet 0.5-0.1 MG tablet Take 1 tablet by mouth at bedtime.    Marland Kitchen OVER THE COUNTER MEDICATION Vit d3 one daily Ginger root biotin     No current facility-administered medications on file prior to visit.    Allergies  Allergen Reactions  . Sulfa Antibiotics Anaphylaxis  . Penicillins     Unknown reaction - was told d/t reaction sulfa drugs   Social History   Socioeconomic History  . Marital status: Married    Spouse name: Not on file  . Number of children: Not on file  . Years of education: Not on file  . Highest education level: Not on file  Occupational History  . Not on file  Tobacco Use  .  Smoking status: Former Smoker    Quit date: 07/07/2015    Years since quitting: 4.1  . Smokeless tobacco: Never Used  Substance and Sexual Activity  . Alcohol use: Not on file    Comment: Occasional  . Drug use: Never  . Sexual activity: Not on file  Other Topics Concern  . Not on file  Social History Narrative  . Not on file   Social Determinants of Health   Financial Resource Strain:   . Difficulty of Paying Living Expenses:   Food Insecurity:   . Worried About Programme researcher, broadcasting/film/video in the Last Year:   . Barista in the  Last Year:   Transportation Needs:   . Freight forwarder (Medical):   Marland Kitchen Lack of Transportation (Non-Medical):   Physical Activity:   . Days of Exercise per Week:   . Minutes of Exercise per Session:   Stress:   . Feeling of Stress :   Social Connections:   . Frequency of Communication with Friends and Family:   . Frequency of Social Gatherings with Friends and Family:   . Attends Religious Services:   . Active Member of Clubs or Organizations:   . Attends Banker Meetings:   Marland Kitchen Marital Status:   Intimate Partner Violence:   . Fear of Current or Ex-Partner:   . Emotionally Abused:   Marland Kitchen Physically Abused:   . Sexually Abused:    Family History  Problem Relation Age of Onset  . Dementia Mother   . Stroke Father      Review of Systems  All other systems reviewed and are negative.      Objective:   Physical Exam Vitals reviewed.  Constitutional:      Appearance: Normal appearance. She is normal weight.  Cardiovascular:     Rate and Rhythm: Normal rate and regular rhythm.     Heart sounds: Normal heart sounds. No murmur heard.  No friction rub. No gallop.   Pulmonary:     Effort: Pulmonary effort is normal.     Breath sounds: Normal breath sounds.  Neurological:     Mental Status: She is alert.           Assessment & Plan:  Carpal tunnel syndrome of right wrist  Using sterile technique a mixture of 1 cc of 0.1% lidocaine without epinephrine and 1 cc of 40 mg/mL Kenalog was injected adjacent to the palmaris longus tendon at the distal flexor crease into the carpal tunnel.  Aspiration was performed to ensure no intravascular injection.  Patient tolerated the procedure well without complication.  Await the results of the cortisone injection.  If pain pattern does not improve, consider nerve conduction studies to evaluate for possible cervical radiculopathy.

## 2019-08-19 ENCOUNTER — Encounter: Payer: Self-pay | Admitting: Gastroenterology

## 2019-08-24 ENCOUNTER — Other Ambulatory Visit: Payer: Self-pay | Admitting: Family Medicine

## 2019-08-24 DIAGNOSIS — Z780 Asymptomatic menopausal state: Secondary | ICD-10-CM

## 2019-08-24 DIAGNOSIS — E2839 Other primary ovarian failure: Secondary | ICD-10-CM

## 2019-09-07 ENCOUNTER — Encounter: Payer: Self-pay | Admitting: Family Medicine

## 2019-09-09 ENCOUNTER — Ambulatory Visit (INDEPENDENT_AMBULATORY_CARE_PROVIDER_SITE_OTHER): Payer: 59 | Admitting: Family Medicine

## 2019-09-09 ENCOUNTER — Other Ambulatory Visit: Payer: Self-pay

## 2019-09-09 VITALS — BP 140/80 | HR 71 | Temp 98.2°F | Ht 65.0 in | Wt 195.0 lb

## 2019-09-09 DIAGNOSIS — R1013 Epigastric pain: Secondary | ICD-10-CM

## 2019-09-09 MED ORDER — PANTOPRAZOLE SODIUM 40 MG PO TBEC: 40.0000 mg | DELAYED_RELEASE_TABLET | Freq: Two times a day (BID) | ORAL | 3 refills | Status: DC

## 2019-09-09 NOTE — Progress Notes (Signed)
Subjective:    Patient ID: Heidi Mckee, female    DOB: July 15, 1955, 64 y.o.   MRN: [id number]   Patient is being seen today for abdominal pain.  The pain is located just below the xiphoid process.  She describes it as a pressure-like sensation.  It is made worse by food such as tomatoes.  She denies any melena or hematochezia.  She denies any nausea or vomiting.  She denies any diarrhea or constipation.  She denies any dysuria or hematuria.  She denies any fevers or chills.  She denies any weight loss.  Her abdomen is nontender on palpation.  There is no guarding.  There is no rebound.  Her abdomen is soft and nondistended.  Movement does not make the pain better or worse.  Food does seem to make it worse.  She has been taking NSAIDs recently for neck pain  Past Medical History:  Diagnosis Date  . Carpal tunnel syndrome of right wrist 05/12/2017   Past Surgical History:  Procedure Laterality Date  . CESAREAN SECTION     Current Outpatient Medications on File Prior to Visit  Medication Sig Dispense Refill  . Estradiol-Norethindrone Acet 0.5-0.1 MG tablet Take 1 tablet by mouth at bedtime.    Marland Kitchen OVER THE COUNTER MEDICATION Vit d3 one daily Ginger root biotin     No current facility-administered medications on file prior to visit.    Allergies  Allergen Reactions  . Sulfa Antibiotics Anaphylaxis  . Penicillins     Unknown reaction - was told d/t reaction sulfa drugs   Social History   Socioeconomic History  . Marital status: Married    Spouse name: Not on file  . Number of children: Not on file  . Years of education: Not on file  . Highest education level: Not on file  Occupational History  . Not on file  Tobacco Use  . Smoking status: Former Smoker    Quit date: 07/07/2015    Years since quitting: 4.1  . Smokeless tobacco: Never Used  Substance and Sexual Activity  . Alcohol use: Not on file    Comment: Occasional  . Drug use: Never  . Sexual activity: Not on file   Other Topics Concern  . Not on file  Social History Narrative  . Not on file   Social Determinants of Health   Financial Resource Strain:   . Difficulty of Paying Living Expenses: Not on file  Food Insecurity:   . Worried About Programme researcher, broadcasting/film/video in the Last Year: Not on file  . Ran Out of Food in the Last Year: Not on file  Transportation Needs:   . Lack of Transportation (Medical): Not on file  . Lack of Transportation (Non-Medical): Not on file  Physical Activity:   . Days of Exercise per Week: Not on file  . Minutes of Exercise per Session: Not on file  Stress:   . Feeling of Stress : Not on file  Social Connections:   . Frequency of Communication with Friends and Family: Not on file  . Frequency of Social Gatherings with Friends and Family: Not on file  . Attends Religious Services: Not on file  . Active Member of Clubs or Organizations: Not on file  . Attends Banker Meetings: Not on file  . Marital Status: Not on file  Intimate Partner Violence:   . Fear of Current or Ex-Partner: Not on file  . Emotionally Abused: Not on file  . Physically  Abused: Not on file  . Sexually Abused: Not on file   Family History  Problem Relation Age of Onset  . Dementia Mother   . Stroke Father      Review of Systems  Gastrointestinal: Positive for abdominal pain.  All other systems reviewed and are negative.      Objective:   Physical Exam Vitals reviewed.  Constitutional:      General: She is not in acute distress.    Appearance: Normal appearance. She is normal weight. She is not ill-appearing, toxic-appearing or diaphoretic.  HENT:     Right Ear: Tympanic membrane and ear canal normal. There is no impacted cerumen.     Left Ear: Tympanic membrane and ear canal normal. There is no impacted cerumen.     Nose: Nose normal. No congestion or rhinorrhea.     Mouth/Throat:     Pharynx: No posterior oropharyngeal erythema.  Eyes:     Conjunctiva/sclera:  Conjunctivae normal.  Neck:     Vascular: No carotid bruit.  Cardiovascular:     Rate and Rhythm: Normal rate and regular rhythm.     Pulses: Normal pulses.     Heart sounds: Normal heart sounds. No murmur heard.  No friction rub. No gallop.   Pulmonary:     Effort: Pulmonary effort is normal. No respiratory distress.     Breath sounds: Normal breath sounds. No stridor. No wheezing, rhonchi or rales.  Chest:     Chest wall: No tenderness.  Abdominal:     General: Bowel sounds are normal. There is no distension.     Palpations: Abdomen is soft. There is no hepatomegaly, splenomegaly or mass.     Tenderness: There is no abdominal tenderness. There is no right CVA tenderness, left CVA tenderness, guarding or rebound.     Hernia: No hernia is present.  Musculoskeletal:     Cervical back: Normal range of motion and neck supple. No rigidity.     Right lower leg: No edema.     Left lower leg: No edema.  Lymphadenopathy:     Cervical: No cervical adenopathy.  Skin:    Coloration: Skin is not jaundiced or pale.     Findings: No bruising, erythema, lesion or rash.  Neurological:     General: No focal deficit present.     Mental Status: She is alert and oriented to person, place, and time. Mental status is at baseline.     Sensory: No sensory deficit.     Motor: No weakness.     Coordination: Coordination normal.     Gait: Gait normal.     Deep Tendon Reflexes: Reflexes normal.  Psychiatric:        Mood and Affect: Mood normal.        Behavior: Behavior normal.        Thought Content: Thought content normal.        Judgment: Judgment normal.           Assessment & Plan:  Abdominal pain, epigastric  I suspect gastritis versus peptic ulcer disease.  Begin Protonix 40 mg p.o. twice daily and reassess in 10 to 14 days.  If pain is not improving, consider lab work to check a lipase however there is no evidence of an acute abdomen on today's exam.  Also consider a breath test to  check for H. pylori.  If pain is not improving also consider GI consultation for EGD

## 2019-09-13 ENCOUNTER — Other Ambulatory Visit: Payer: Self-pay

## 2019-09-13 ENCOUNTER — Ambulatory Visit (AMBULATORY_SURGERY_CENTER): Payer: Self-pay

## 2019-09-13 VITALS — Ht 65.0 in | Wt 196.0 lb

## 2019-09-13 DIAGNOSIS — Z1211 Encounter for screening for malignant neoplasm of colon: Secondary | ICD-10-CM

## 2019-09-13 MED ORDER — PLENVU 140 G PO SOLR: 1.0000 | ORAL | 0 refills | Status: DC

## 2019-09-13 NOTE — Progress Notes (Signed)
No allergies to soy or egg Pt is not on blood thinners or diet pills Denies issues with sedation/intubation Denies atrial flutter/fib Denies constipation   Pt is aware of Covid safety and care partner requirements.      

## 2019-09-15 ENCOUNTER — Encounter: Payer: Self-pay | Admitting: Gastroenterology

## 2019-09-16 ENCOUNTER — Encounter: Payer: Self-pay | Admitting: Family Medicine

## 2019-10-17 ENCOUNTER — Encounter: Payer: Self-pay | Admitting: Family Medicine

## 2019-10-19 ENCOUNTER — Encounter: Payer: Self-pay | Admitting: Gastroenterology

## 2019-10-19 ENCOUNTER — Other Ambulatory Visit: Payer: Self-pay

## 2019-10-19 ENCOUNTER — Ambulatory Visit (AMBULATORY_SURGERY_CENTER): Payer: 59 | Admitting: Gastroenterology

## 2019-10-19 VITALS — BP 160/51 | HR 54 | Temp 98.4°F | Resp 20 | Ht 65.0 in | Wt 196.0 lb

## 2019-10-19 DIAGNOSIS — Z1211 Encounter for screening for malignant neoplasm of colon: Secondary | ICD-10-CM | POA: Diagnosis not present

## 2019-10-19 HISTORY — PX: COLONOSCOPY: SHX174

## 2019-10-19 MED ORDER — SODIUM CHLORIDE 0.9 % IV SOLN: 500.0000 mL | Freq: Once | INTRAVENOUS | Status: DC

## 2019-10-19 NOTE — Patient Instructions (Signed)
Handout provided on diverticulosis.   YOU HAD AN ENDOSCOPIC PROCEDURE TODAY AT THE [facility]:   Refer to the procedure report that was given to you for any specific questions about what was found during the examination.  If the procedure report does not answer your questions, please call your gastroenterologist to clarify.  If you requested that your care partner not be given the details of your procedure findings, then the procedure report has been included in a sealed envelope for you to review at your convenience later.  YOU SHOULD EXPECT: Some feelings of bloating in the abdomen. Passage of more gas than usual.  Walking can help get rid of the air that was put into your GI tract during the procedure and reduce the bloating. If you had a lower endoscopy (such as a colonoscopy or flexible sigmoidoscopy) you may notice spotting of blood in your stool or on the toilet paper. If you underwent a bowel prep for your procedure, you may not have a normal bowel movement for a few days.  Please Note:  You might notice some irritation and congestion in your nose or some drainage.  This is from the oxygen used during your procedure.  There is no need for concern and it should clear up in a day or so.  SYMPTOMS TO REPORT IMMEDIATELY:  Following lower endoscopy (colonoscopy or flexible sigmoidoscopy):  Excessive amounts of blood in the stool  Significant tenderness or worsening of abdominal pains  Swelling of the abdomen that is new, acute  Fever of 100F or higher  For urgent or emergent issues, a gastroenterologist can be reached at any hour by calling [phone]. Do not use MyChart messaging for urgent concerns.    DIET:  We do recommend a small meal at first, but then you may proceed to your regular diet.  Drink plenty of fluids but you should avoid alcoholic beverages for 24 hours.  ACTIVITY:  You should plan to take it easy for the rest of today and you should NOT DRIVE or use  heavy machinery until tomorrow (because of the sedation medicines used during the test).    FOLLOW UP: Our staff will call the number listed on your records 48-72 hours following your procedure to check on you and address any questions or concerns that you may have regarding the information given to you following your procedure. If we do not reach you, we will leave a message.  We will attempt to reach you two times.  During this call, we will ask if you have developed any symptoms of COVID 19. If you develop any symptoms (ie: fever, flu-like symptoms, shortness of breath, cough etc.) before then, please call [phone].  If you test positive for Covid 19 in the 2 weeks post procedure, please call and report this information to us.    If any biopsies were taken you will be contacted by phone or by letter within the next 1-3 weeks.  Please call us at [phone] if you have not heard about the biopsies in 3 weeks.    SIGNATURES/CONFIDENTIALITY: You and/or your care partner have signed paperwork which will be entered into your electronic medical record.  These signatures attest to the fact that that the information above on your After Visit Summary has been reviewed and is understood.  Full responsibility of the confidentiality of this discharge information lies with you and/or your care-partner.  

## 2019-10-19 NOTE — Progress Notes (Signed)
VS done by CW  No changes medical or social hx since previsit.

## 2019-10-19 NOTE — Op Note (Signed)
[facility] Patient Name: Heidi Mckee Procedure Date: 10/19/2019 9:12 AM MRN: [id number] Endoscopist: Rachael Fee , MD Age: 64 Referring MD:  Date of Birth: 03-14-1955 Gender: Female Account #: [id number] Procedure:                Colonoscopy Indications:              Screening for colorectal malignant neoplasm Medicines:                Monitored Anesthesia Care Procedure:                Pre-Anesthesia Assessment:                           - Prior to the procedure, a History and Physical                            was performed, and patient medications and                            allergies were reviewed. The patient's tolerance of                            previous anesthesia was also reviewed. The risks                            and benefits of the procedure and the sedation                            options and risks were discussed with the patient.                            All questions were answered, and informed consent                            was obtained. Prior Anticoagulants: The patient has                            taken no previous anticoagulant or antiplatelet                            agents. ASA Grade Assessment: II - A patient with                            mild systemic disease. After reviewing the risks                            and benefits, the patient was deemed in                            satisfactory condition to undergo the procedure.                           After obtaining informed consent, the colonoscope  was passed under direct vision. Throughout the                            procedure, the patient's blood pressure, pulse, and                            oxygen saturations were monitored continuously. The                            Colonoscope was introduced through the anus and                            advanced to the the cecum, identified by                            appendiceal orifice and  ileocecal valve. The                            colonoscopy was performed without difficulty. The                            patient tolerated the procedure well. The quality                            of the bowel preparation was good. The ileocecal                            valve, appendiceal orifice, and rectum were                            photographed. Scope In: 9:15:47 AM Scope Out: 9:25:11 AM Scope Withdrawal Time: 0 hours 5 minutes 44 seconds  Total Procedure Duration: 0 hours 9 minutes 24 seconds  Findings:                 A few small-mouthed diverticula were found in the                            left colon.                           The exam was otherwise without abnormality on                            direct and retroflexion views. Complications:            No immediate complications. Estimated blood loss:                            None. Estimated Blood Loss:     Estimated blood loss: none. Impression:               - Diverticulosis in the left colon.                           - The examination was otherwise normal on direct  and retroflexion views.                           - No polyps or cancers. Recommendation:           - Patient has a contact number available for                            emergencies. The signs and symptoms of potential                            delayed complications were discussed with the                            patient. Return to normal activities tomorrow.                            Written discharge instructions were provided to the                            patient.                           - Resume previous diet.                           - Continue present medications.                           - Repeat colon cancer screening with a colonoscopy                            in 10 years. Rachael Fee, MD 10/19/2019 9:27:48 AM This report has been signed electronically.

## 2019-10-21 ENCOUNTER — Telehealth: Payer: Self-pay | Admitting: *Deleted

## 2019-10-21 NOTE — Telephone Encounter (Signed)
  Follow up Call-  Call back number 10/19/2019  Post procedure Call Back phone  # [phone]  Permission to leave phone message Yes  Some recent data might be hidden     Patient questions:  Message left to call us if necessary.

## 2019-10-21 NOTE — Telephone Encounter (Signed)
°  Follow up Call-  Call back number 10/19/2019  Post procedure Call Back phone  # [phone]  Permission to leave phone message Yes  Some recent data might be hidden     Patient questions:  Message left to call us if necessary.  Second call.

## 2019-11-17 ENCOUNTER — Other Ambulatory Visit: Payer: 59

## 2020-01-21 ENCOUNTER — Encounter: Payer: Self-pay | Admitting: Family Medicine

## 2020-01-24 ENCOUNTER — Other Ambulatory Visit: Payer: Self-pay | Admitting: Family Medicine

## 2020-01-24 MED ORDER — PANTOPRAZOLE SODIUM 40 MG PO TBEC
40.0000 mg | DELAYED_RELEASE_TABLET | Freq: Every day | ORAL | 3 refills | Status: DC
Start: 2020-01-24 — End: 2020-01-26

## 2020-01-26 ENCOUNTER — Other Ambulatory Visit: Payer: Self-pay | Admitting: *Deleted

## 2020-01-26 MED ORDER — PANTOPRAZOLE SODIUM 40 MG PO TBEC: 40.0000 mg | DELAYED_RELEASE_TABLET | Freq: Every day | ORAL | 3 refills | Status: DC

## 2020-05-25 ENCOUNTER — Other Ambulatory Visit: Payer: Self-pay | Admitting: *Deleted

## 2020-05-25 MED ORDER — PANTOPRAZOLE SODIUM 40 MG PO TBEC: 40.0000 mg | DELAYED_RELEASE_TABLET | Freq: Every day | ORAL | 0 refills | Status: DC

## 2020-06-05 ENCOUNTER — Encounter: Payer: Self-pay | Admitting: Family Medicine

## 2020-06-13 ENCOUNTER — Ambulatory Visit: Payer: 59 | Admitting: Nurse Practitioner

## 2020-07-19 ENCOUNTER — Ambulatory Visit: Payer: Self-pay | Admitting: Family Medicine

## 2020-08-16 ENCOUNTER — Ambulatory Visit (INDEPENDENT_AMBULATORY_CARE_PROVIDER_SITE_OTHER): Payer: 59 | Admitting: Family Medicine

## 2020-08-16 ENCOUNTER — Other Ambulatory Visit: Payer: Self-pay

## 2020-08-16 ENCOUNTER — Encounter: Payer: Self-pay | Admitting: Family Medicine

## 2020-08-16 VITALS — BP 110/64 | HR 72 | Temp 98.5°F | Resp 16 | Ht 65.0 in | Wt 194.0 lb

## 2020-08-16 DIAGNOSIS — Z0000 Encounter for general adult medical examination without abnormal findings: Secondary | ICD-10-CM

## 2020-08-16 LAB — LIPID PANEL
Cholesterol: 217 mg/dL — ABNORMAL HIGH (ref <200)
HDL: 67 mg/dL (ref >=50)
LDL Cholesterol (Calc): 125 mg/dL (calc) — ABNORMAL HIGH
Non-HDL Cholesterol (Calc): 150 mg/dL (calc) — ABNORMAL HIGH (ref <130)
Total CHOL/HDL Ratio: 3.2 (calc) (ref <5.0)
Triglycerides: 135 mg/dL (ref <150)

## 2020-08-16 LAB — CBC WITH DIFFERENTIAL/PLATELET
Absolute Monocytes: 660 cells/uL (ref 200–950)
Basophils Absolute: 42 cells/uL (ref 0–200)
Basophils Relative: 0.7 %
Eosinophils Absolute: 168 cells/uL (ref 15–500)
Eosinophils Relative: 2.8 %
HCT: 43.9 % (ref 35.0–45.0)
Hemoglobin: 14.4 g/dL (ref 11.7–15.5)
Lymphs Abs: 1122 cells/uL (ref 850–3900)
MCH: 29.1 pg (ref 27.0–33.0)
MCHC: 32.8 g/dL (ref 32.0–36.0)
MCV: 88.7 fL (ref 80.0–100.0)
MPV: 9.5 fL (ref 7.5–12.5)
Monocytes Relative: 11 %
Neutro Abs: 4008 cells/uL (ref 1500–7800)
Neutrophils Relative %: 66.8 %
Platelets: 317 10*3/uL (ref 140–400)
RBC: 4.95 10*6/uL (ref 3.80–5.10)
RDW: 13.2 % (ref 11.0–15.0)
Total Lymphocyte: 18.7 %
WBC: 6 10*3/uL (ref 3.8–10.8)

## 2020-08-16 LAB — COMPLETE METABOLIC PANEL WITH GFR
AG Ratio: 1.4 (calc) (ref 1.0–2.5)
ALT: 15 U/L (ref 6–29)
AST: 15 U/L (ref 10–35)
Albumin: 3.9 g/dL (ref 3.6–5.1)
Alkaline phosphatase (APISO): 28 U/L — ABNORMAL LOW (ref 37–153)
BUN: 11 mg/dL (ref 7–25)
CO2: 27 mmol/L (ref 20–32)
Calcium: 9 mg/dL (ref 8.6–10.4)
Chloride: 104 mmol/L (ref 98–110)
Creat: 0.61 mg/dL (ref 0.50–1.05)
Globulin: 2.7 g/dL (calc) (ref 1.9–3.7)
Glucose, Bld: 89 mg/dL (ref 65–99)
Potassium: 4.6 mmol/L (ref 3.5–5.3)
Sodium: 138 mmol/L (ref 135–146)
Total Bilirubin: 0.5 mg/dL (ref 0.2–1.2)
Total Protein: 6.6 g/dL (ref 6.1–8.1)
eGFR: 100 mL/min/{1.73_m2} (ref >=60)

## 2020-08-16 MED ORDER — PANTOPRAZOLE SODIUM 40 MG PO TBEC: 40.0000 mg | DELAYED_RELEASE_TABLET | Freq: Every day | ORAL | 3 refills | Status: DC

## 2020-08-16 NOTE — Progress Notes (Signed)
Subjective:     Patient ID: Heidi Mckee, female   DOB: [birth year], 65 y.o.   MRN: [id number]  HPI Patient is a very pleasant 65 year old Caucasian female who is here today for a physical exam.  She started having more stomach discomfort again.  Previously this resolved when she started taking a PPI for 4 weeks.  She was asymptomatic for 6 months however the symptoms of started to recur again.  This includes epigastric discomfort, heartburn, indigestion.  She denies any melena or hematochezia.  She sees a gynecologist who performs her Pap smears.  Last mammogram was September 2021.  She is already scheduled her mammogram and Pap smear for this year in October.  Her last colonoscopy was in 2021 and therefore is up-to-date. Past Medical History:  Diagnosis Date   Blood transfusion without reported diagnosis 1986   Carpal tunnel syndrome    Carpal tunnel syndrome of right wrist 05/12/2017   Chronic kidney disease    UTI x 1   Heart murmur    MVP 34 yrs ago   Current Outpatient Medications on File Prior to Visit  Medication Sig Dispense Refill   Estradiol-Norethindrone Acet 0.5-0.1 MG tablet Take 1 tablet by mouth at bedtime.     OVER THE COUNTER MEDICATION Vit d3 one daily Ginger root biotin     No current facility-administered medications on file prior to visit.   Allergies  Allergen Reactions   Sulfa Antibiotics Anaphylaxis   Penicillins     Unknown reaction - was told d/t reaction sulfa drugs   Social History   Socioeconomic History   Marital status: Married    Spouse name: Not on file   Number of children: Not on file   Years of education: Not on file   Highest education level: Not on file  Occupational History   Not on file  Tobacco Use   Smoking status: Former    Types: Cigarettes    Quit date: 07/07/2015    Years since quitting: 5.1   Smokeless tobacco: Never  Vaping Use   Vaping Use: Every day   Substances: Nicotine, Flavoring  Substance and Sexual Activity    Alcohol use: Yes    Comment: Occasional   Drug use: Never   Sexual activity: Not on file  Other Topics Concern   Not on file  Social History Narrative   Not on file   Social Determinants of Health   Financial Resource Strain: Not on file  Food Insecurity: Not on file  Transportation Needs: Not on file  Physical Activity: Not on file  Stress: Not on file  Social Connections: Not on file  Intimate Partner Violence: Not on file   Family History  Problem Relation Age of Onset   Dementia Mother    Stroke Father    Colon cancer Neg Hx    Colon polyps Neg Hx    Esophageal cancer Neg Hx    Rectal cancer Neg Hx    Stomach cancer Neg Hx   Mother died from vascular dementia.  She does have a family history of strokes.  Review of Systems  All other systems reviewed and are negative.     Objective:   Physical Exam Vitals reviewed.  Constitutional:      General: She is not in acute distress.    Appearance: She is well-developed. She is not diaphoretic.  HENT:     Head: Normocephalic and atraumatic.     Right Ear: External ear normal.  Left Ear: External ear normal.     Nose: Nose normal.     Mouth/Throat:     Pharynx: No oropharyngeal exudate.  Eyes:     General: No scleral icterus.       Right eye: No discharge.        Left eye: No discharge.     Conjunctiva/sclera: Conjunctivae normal.     Pupils: Pupils are equal, round, and reactive to light.  Neck:     Thyroid: No thyromegaly.     Vascular: No JVD.     Trachea: No tracheal deviation.  Cardiovascular:     Rate and Rhythm: Normal rate and regular rhythm.     Heart sounds: Normal heart sounds. No murmur heard.   No friction rub. No gallop.  Pulmonary:     Effort: Pulmonary effort is normal. No respiratory distress.     Breath sounds: Normal breath sounds. No stridor. No wheezing or rales.  Chest:     Chest wall: No tenderness.  Abdominal:     General: Bowel sounds are normal. There is no distension.      Palpations: Abdomen is soft. There is no mass.     Tenderness: There is no abdominal tenderness. There is no guarding or rebound.  Musculoskeletal:        General: No tenderness or deformity. Normal range of motion.     Cervical back: Normal range of motion.  Lymphadenopathy:     Cervical: No cervical adenopathy.  Skin:    General: Skin is warm.     Coloration: Skin is not pale.     Findings: No erythema or rash.  Neurological:     Mental Status: She is alert and oriented to person, place, and time.     Cranial Nerves: No cranial nerve deficit.     Motor: No abnormal muscle tone.     Coordination: Coordination normal.     Deep Tendon Reflexes: Reflexes are normal and symmetric. Reflexes normal.  Psychiatric:        Behavior: Behavior normal.        Thought Content: Thought content normal.        Judgment: Judgment normal.       Assessment:    General medical exam - Plan: CBC with Differential/Platelet, COMPLETE METABOLIC PANEL WITH GFR, Lipid panel   Plan:  Resume Protonix 40 mg daily and then reassess in 3 to 4 weeks.  If still present, I would recommend a GI consultation for dyspepsia and epigastric abdominal pain.  However I suspect this is most likely GERD.  Check CBC CMP and fasting lipid panel. Immunization History  Administered Date(s) Administered   [employer] Vaccination 08/18/2019, 09/15/2019   Tdap 04/06/2017   Zoster Recombinat (Shingrix) 04/06/2017, 07/06/2017   Immunizations are up-to-date although I did recommend a booster on her COVID shot.  Also recommended a flu shot this fall.  Colonoscopy is up-to-date.  Mammogram has already been scheduled.

## 2020-10-17 LAB — HM DEXA SCAN

## 2020-10-22 ENCOUNTER — Ambulatory Visit (INDEPENDENT_AMBULATORY_CARE_PROVIDER_SITE_OTHER): Payer: 59 | Admitting: Family Medicine

## 2020-10-22 ENCOUNTER — Encounter: Payer: Self-pay | Admitting: Family Medicine

## 2020-10-22 ENCOUNTER — Other Ambulatory Visit: Payer: Self-pay

## 2020-10-22 VITALS — BP 128/76 | HR 76 | Temp 98.9°F | Resp 14 | Ht 65.0 in | Wt 194.0 lb

## 2020-10-22 DIAGNOSIS — Z23 Encounter for immunization: Secondary | ICD-10-CM

## 2020-10-22 DIAGNOSIS — G5601 Carpal tunnel syndrome, right upper limb: Secondary | ICD-10-CM

## 2020-10-22 NOTE — Progress Notes (Signed)
Subjective:    Patient ID: Heidi Mckee, female    DOB: 03/28/1955, 65 y.o.   MRN: [id number]  I gave the patient a injection in her right wrist for carpal tunnel syndrome.  She states that it helped for approximately 1 month but now the symptoms have returned.  She complains of numbness in both arms right greater than left from her shoulders to her fingertips.  It is worse from her wrist to her her hand but it does involve the entire arm on both sides.  It primarily occurs at night and is positional and improves with position changes.  However she is concerned because she is now losing grip strength.  She is having to use 2 hands to lift pitchers and milk jugs.  She is starting to drop items and she is losing grip strength.  She also has numbness and tingling in both hands and burning in both hands.  She denies any symptoms in her legs.  The majority of her symptoms are from the elbow down  Past Medical History:  Diagnosis Date   Blood transfusion without reported diagnosis 1986   Carpal tunnel syndrome    Carpal tunnel syndrome of right wrist 05/12/2017   Chronic kidney disease    UTI x 1   Heart murmur    MVP 34 yrs ago   Past Surgical History:  Procedure Laterality Date   CESAREAN SECTION     COLONOSCOPY     10 yrs ago   Current Outpatient Medications on File Prior to Visit  Medication Sig Dispense Refill   Biotin 10 MG CAPS biotin 10,000 mcg capsule   1 capsule every day by oral route.     Cholecalciferol 25 MCG (1000 UT) tablet Vitamin D3 25 mcg (1,000 unit) tablet   1 tablet every day by oral route.     Estradiol-Norethindrone Acet 0.5-0.1 MG tablet Take 1 tablet by mouth at bedtime.     Ginger, Zingiber officinalis, (GINGER ROOT) 500 MG CAPS Ginger Root 500 mg capsule   2 capsules every day by oral route.     Magnesium 200 MG TABS magnesium 200 mg tablet   2 tablets every day by oral route.     omega-3 fish oil (MAXEPA) 1000 MG CAPS capsule Omega 3 Fish Oil     pantoprazole  (PROTONIX) 40 MG tablet Take 1 tablet (40 mg total) by mouth daily. 30 tablet 3   No current facility-administered medications on file prior to visit.    Allergies  Allergen Reactions   Sulfa Antibiotics Anaphylaxis   Penicillins     Unknown reaction - was told d/t reaction sulfa drugs   Social History   Socioeconomic History   Marital status: Married    Spouse name: Not on file   Number of children: Not on file   Years of education: Not on file   Highest education level: Not on file  Occupational History   Not on file  Tobacco Use   Smoking status: Former    Types: Cigarettes    Quit date: 07/07/2015    Years since quitting: 5.2   Smokeless tobacco: Never  Vaping Use   Vaping Use: Every day   Substances: Nicotine, Flavoring  Substance and Sexual Activity   Alcohol use: Yes    Comment: Occasional   Drug use: Never   Sexual activity: Not on file  Other Topics Concern   Not on file  Social History Narrative   Not on file  Social Determinants of Health   Financial Resource Strain: Not on file  Food Insecurity: Not on file  Transportation Needs: Not on file  Physical Activity: Not on file  Stress: Not on file  Social Connections: Not on file  Intimate Partner Violence: Not on file   Family History  Problem Relation Age of Onset   Dementia Mother    Stroke Father    Colon cancer Neg Hx    Colon polyps Neg Hx    Esophageal cancer Neg Hx    Rectal cancer Neg Hx    Stomach cancer Neg Hx      Review of Systems  All other systems reviewed and are negative.     Objective:   Physical Exam Vitals reviewed.  Constitutional:      Appearance: Normal appearance. She is normal weight.  Cardiovascular:     Rate and Rhythm: Normal rate and regular rhythm.     Heart sounds: Normal heart sounds. No murmur heard.   No friction rub. No gallop.  Pulmonary:     Effort: Pulmonary effort is normal.     Breath sounds: Normal breath sounds.  Neurological:     Mental  Status: She is alert.          Assessment & Plan:  Carpal tunnel syndrome of right wrist Schedule nerve conduction studies and if severe carpal tunnel syndrome is confirmed as is my leading suspicion consult hand surgery.

## 2020-10-26 ENCOUNTER — Encounter: Payer: Self-pay | Admitting: Family Medicine

## 2020-11-05 ENCOUNTER — Other Ambulatory Visit: Payer: Self-pay | Admitting: *Deleted

## 2020-11-05 ENCOUNTER — Encounter: Payer: Self-pay | Admitting: Family Medicine

## 2020-11-05 DIAGNOSIS — G5601 Carpal tunnel syndrome, right upper limb: Secondary | ICD-10-CM

## 2020-11-13 NOTE — H&P (Signed)
Heidi Mckee is an 65 y.o. female. She has postmenopausal bleeding. Ultrasound in office is suggestive of EM polyp and possible submucous fibroid.  Pertinent Gynecological History: Menses: post-menopausal Bleeding: post menopausal bleeding Contraception: none DES exposure: denies Blood transfusions: none Sexually transmitted diseases: no past history Previous GYN Procedures: cryo  Last mammogram: normal Date: 10/22 Last pap: normal Date: 6/21 OB History: G1, P1   Menstrual History: Menarche age: unknown No LMP recorded. Patient is postmenopausal.    Past Medical History:  Diagnosis Date   Blood transfusion without reported diagnosis 1986   Carpal tunnel syndrome    Carpal tunnel syndrome of right wrist 05/12/2017   Chronic kidney disease    UTI x 1   Heart murmur    MVP 34 yrs ago    Past Surgical History:  Procedure Laterality Date   CESAREAN SECTION     COLONOSCOPY     10 yrs ago    Family History  Problem Relation Age of Onset   Dementia Mother    Stroke Father    Colon cancer Neg Hx    Colon polyps Neg Hx    Esophageal cancer Neg Hx    Rectal cancer Neg Hx    Stomach cancer Neg Hx     Social History:  reports that she quit smoking about 5 years ago. Her smoking use included cigarettes. She has never used smokeless tobacco. She reports current alcohol use. She reports that she does not use drugs.  Allergies:  Allergies  Allergen Reactions   Sulfa Antibiotics Anaphylaxis   Penicillins     Unknown reaction - was told d/t reaction sulfa drugs    No medications prior to admission.    Review of Systems  Constitutional:  Negative for fever.   There were no vitals taken for this visit. Physical Exam Cardiovascular:     Rate and Rhythm: Normal rate.  Pulmonary:     Effort: Pulmonary effort is normal.    No results found for this or any previous visit (from the past 24 hour(s)).  No results found.  Assessment/Plan: 65 yo with PMB D/W H/S, D&C,  possible Myosure resection. Risks reviewed including infection, uterine perforation and organ damage, bleeding/transfusion-HIV/Hep, DVT/PE, pneumonia, recurrent PMB. She states she understands and agrees.  Roselle Locus II 11/13/2020, 2:00 PM

## 2020-11-16 ENCOUNTER — Encounter: Payer: Self-pay | Admitting: Family Medicine

## 2020-11-20 ENCOUNTER — Encounter (HOSPITAL_COMMUNITY)
Admission: RE | Admit: 2020-11-20 | Discharge: 2020-11-20 | Disposition: A | Discharge status: Home / Self Care | Payer: 59 | Source: Ambulatory Visit | Attending: Obstetrics and Gynecology | Admitting: Obstetrics and Gynecology

## 2020-11-20 ENCOUNTER — Encounter: Payer: Self-pay | Admitting: Family Medicine

## 2020-11-20 ENCOUNTER — Encounter (HOSPITAL_BASED_OUTPATIENT_CLINIC_OR_DEPARTMENT_OTHER): Payer: Self-pay | Admitting: Obstetrics and Gynecology

## 2020-11-20 ENCOUNTER — Other Ambulatory Visit: Payer: Self-pay

## 2020-11-20 VITALS — BP 151/75 | HR 80 | Temp 99.2°F | Resp 16 | Ht 65.0 in | Wt 195.0 lb

## 2020-11-20 DIAGNOSIS — Z01812 Encounter for preprocedural laboratory examination: Secondary | ICD-10-CM | POA: Diagnosis not present

## 2020-11-20 DIAGNOSIS — N950 Postmenopausal bleeding: Secondary | ICD-10-CM | POA: Diagnosis not present

## 2020-11-20 DIAGNOSIS — H9190 Unspecified hearing loss, unspecified ear: Secondary | ICD-10-CM

## 2020-11-20 DIAGNOSIS — Z01818 Encounter for other preprocedural examination: Secondary | ICD-10-CM

## 2020-11-20 HISTORY — DX: Unspecified hearing loss, unspecified ear: H91.90

## 2020-11-20 LAB — CBC
HCT: 43.9 % (ref 36.0–46.0)
Hemoglobin: 14.5 g/dL (ref 12.0–15.0)
MCH: 29.1 pg (ref 26.0–34.0)
MCHC: 33 g/dL (ref 30.0–36.0)
MCV: 88.2 fL (ref 80.0–100.0)
Platelets: 366 10*3/uL (ref 150–400)
RBC: 4.98 MIL/uL (ref 3.87–5.11)
RDW: 13.7 % (ref 11.5–15.5)
WBC: 5.8 10*3/uL (ref 4.0–10.5)
nRBC: 0 % (ref 0.0–0.2)

## 2020-11-20 LAB — BASIC METABOLIC PANEL
Anion gap: 7 (ref 5–15)
BUN: 12 mg/dL (ref 8–23)
CO2: 27 mmol/L (ref 22–32)
Calcium: 9.3 mg/dL (ref 8.9–10.3)
Chloride: 103 mmol/L (ref 98–111)
Creatinine, Ser: 0.74 mg/dL (ref 0.44–1.00)
GFR, Estimated: >60 mL/min (ref >60)
Glucose, Bld: 92 mg/dL (ref 70–99)
Potassium: 4.2 mmol/L (ref 3.5–5.1)
Sodium: 137 mmol/L (ref 135–145)

## 2020-11-20 NOTE — Progress Notes (Signed)
Spoke w/ via phone for pre-op interview---patient Lab needs dos---- ISTAT              Lab results------11/20/2020 CBC, BMP, and type & screen COVID test -----patient states asymptomatic no test needed Arrive at -------0530 on 11/23/2020 NPO after MN NO Solid Food.  Clear liquids from MN until---0430 Med rec completed Medications to take morning of surgery -----Protonix, Progesterone Diabetic medication -----n/a Patient instructed no nail polish to be worn day of surgery Patient instructed to bring photo id and insurance card day of surgery Patient aware to have Driver (ride ) / caregiver    for 24 hours after surgery - husband Amada Jupiter Patient Special Instructions -----none Pre-Op special Istructions -----Patient is [facility] and wears hearing aids. Patient verbalized understanding of instructions that were given at this phone interview. Patient denies shortness of breath, chest pain, fever, cough at this phone interview.

## 2020-11-20 NOTE — Progress Notes (Signed)
No I stat needed dos

## 2020-11-21 ENCOUNTER — Encounter: Payer: Self-pay | Admitting: *Deleted

## 2020-11-22 NOTE — Anesthesia Preprocedure Evaluation (Addendum)
Anesthesia Evaluation  Patient identified by MRN, date of birth, ID band Patient awake    Reviewed: Allergy & Precautions, H&P , NPO status , Patient's Chart, lab work & pertinent test results  Airway Mallampati: II  TM Distance: >3 FB Neck ROM: Full    Dental no notable dental hx.    Pulmonary neg pulmonary ROS, former smoker,    Pulmonary exam normal breath sounds clear to auscultation       Cardiovascular negative cardio ROS Normal cardiovascular exam Rhythm:Regular Rate:Normal     Neuro/Psych negative neurological ROS  negative psych ROS   GI/Hepatic negative GI ROS, Neg liver ROS,   Endo/Other  negative endocrine ROS  Renal/GU negative Renal ROS  negative genitourinary   Musculoskeletal negative musculoskeletal ROS (+)   Abdominal   Peds negative pediatric ROS (+)  Hematology negative hematology ROS (+)   Anesthesia Other Findings   Reproductive/Obstetrics negative OB ROS                             Anesthesia Physical Anesthesia Plan  ASA: 1  Anesthesia Plan: General   Post-op Pain Management:    Induction: Intravenous  PONV Risk Score and Plan: 3 and Ondansetron, Dexamethasone and Treatment may vary due to age or medical condition  Airway Management Planned: LMA  Additional Equipment:   Intra-op Plan:   Post-operative Plan: Extubation in OR  Informed Consent: I have reviewed the patients History and Physical, chart, labs and discussed the procedure including the risks, benefits and alternatives for the proposed anesthesia with the patient or authorized representative who has indicated his/her understanding and acceptance.     Dental advisory given  Plan Discussed with: CRNA and Surgeon  Anesthesia Plan Comments:         Anesthesia Quick Evaluation

## 2020-11-22 NOTE — H&P (Signed)
Heidi Mckee is an 65 y.o. female. She had postmenopausal bleeding. Ultrasound in the office was suggestive of a polypoid mass in the lower EM cavity. Also possible 3.4 mm submucous fibroid.  Pertinent Gynecological History: Menses: post-menopausal Bleeding: post menopausal bleeding Contraception: none DES exposure: denies Blood transfusions: none Sexually transmitted diseases: no past history Previous GYN Procedures:  Last mammogram: normal Date: 10/22 Last pap: normal Date: 6/21 OB History: G1, P1   Menstrual History: Menarche age: unknown No LMP recorded. Patient is postmenopausal.    Past Medical History:  Diagnosis Date   Bleeds easily (HCC)    per patient 11/20/2020 Pt states that when she was younger she had frequent nosebleeds. She states that it takes longer for her blood to clot. She did require a transfusion after a CSection but doesn't know why. She states that it has gotten better as she has gotten older. She states that she has never been diagnosed with any disorder.   Blood transfusion without reported diagnosis 1986   Carpal tunnel syndrome    Carpal tunnel syndrome of right wrist 05/12/2017   Heart murmur    MVP 34 yrs ago   HOH (hard of hearing) 11/20/2020   Patient has bilateral hearing aids.   Hx of migraines    11/20/20 pt states that she had migraines in her teens and 20's   UTI (urinary tract infection)     Past Surgical History:  Procedure Laterality Date   CESAREAN SECTION  1986   COLONOSCOPY     10 yrs ago   COLONOSCOPY  10/19/2019    Family History  Problem Relation Age of Onset   Dementia Mother    Stroke Father    Colon cancer Neg Hx    Colon polyps Neg Hx    Esophageal cancer Neg Hx    Rectal cancer Neg Hx    Stomach cancer Neg Hx     Social History:  reports that she quit smoking about 5 years ago. Her smoking use included cigarettes. She has a 20.00 pack-year smoking history. She has never used smokeless tobacco. She reports  current alcohol use. She reports that she does not use drugs.  Allergies:  Allergies  Allergen Reactions   Sulfa Antibiotics Anaphylaxis   Penicillins     Unknown reaction - was told d/t reaction sulfa drugs    No medications prior to admission.    Review of Systems  Constitutional:  Negative for fever.   There were no vitals taken for this visit. Physical Exam Cardiovascular:     Rate and Rhythm: Normal rate.  Pulmonary:     Effort: Pulmonary effort is normal.    No results found for this or any previous visit (from the past 24 hour(s)).  No results found.  Assessment/Plan: 65 yo with PMB D/W hysteroscopy, dilation and curettage, possible Myosure resection. Risks reviewed including infection, uterine perforation and organ damage, bleeding/transfusion-HIV/Hep, DVT/PE, pneumonia, recurrent PMB. She states she understands and agrees  Roselle Locus II 11/22/2020, 6:13 PM

## 2020-11-23 ENCOUNTER — Ambulatory Visit (HOSPITAL_BASED_OUTPATIENT_CLINIC_OR_DEPARTMENT_OTHER)
Admission: RE | Admit: 2020-11-23 | Discharge: 2020-11-23 | Disposition: A | Discharge status: Home / Self Care | Payer: 59 | Attending: Obstetrics and Gynecology | Admitting: Obstetrics and Gynecology

## 2020-11-23 ENCOUNTER — Encounter (HOSPITAL_BASED_OUTPATIENT_CLINIC_OR_DEPARTMENT_OTHER)
Admission: RE | Disposition: A | Discharge status: Home / Self Care | Payer: Self-pay | Source: Home / Self Care | Attending: Obstetrics and Gynecology

## 2020-11-23 ENCOUNTER — Other Ambulatory Visit: Payer: Self-pay

## 2020-11-23 ENCOUNTER — Ambulatory Visit (HOSPITAL_BASED_OUTPATIENT_CLINIC_OR_DEPARTMENT_OTHER): Payer: 59 | Admitting: Anesthesiology

## 2020-11-23 ENCOUNTER — Encounter (HOSPITAL_BASED_OUTPATIENT_CLINIC_OR_DEPARTMENT_OTHER): Payer: Self-pay | Admitting: Obstetrics and Gynecology

## 2020-11-23 DIAGNOSIS — Z87891 Personal history of nicotine dependence: Secondary | ICD-10-CM | POA: Insufficient documentation

## 2020-11-23 DIAGNOSIS — N858 Other specified noninflammatory disorders of uterus: Secondary | ICD-10-CM | POA: Insufficient documentation

## 2020-11-23 DIAGNOSIS — N950 Postmenopausal bleeding: Secondary | ICD-10-CM | POA: Insufficient documentation

## 2020-11-23 HISTORY — DX: Personal history of other diseases of the nervous system and sense organs: Z86.69

## 2020-11-23 HISTORY — PX: DILATATION & CURETTAGE/HYSTEROSCOPY WITH MYOSURE: SHX6511

## 2020-11-23 HISTORY — DX: Hemorrhagic condition, unspecified: D69.9

## 2020-11-23 HISTORY — DX: Urinary tract infection, site not specified: N39.0

## 2020-11-23 LAB — TYPE AND SCREEN
ABO/RH(D): A POS
Antibody Screen: NEGATIVE

## 2020-11-23 LAB — ABO/RH: ABO/RH(D): A POS

## 2020-11-23 SURGERY — DILATATION & CURETTAGE/HYSTEROSCOPY WITH MYOSURE
Anesthesia: General | Site: Vagina

## 2020-11-23 MED ORDER — MIDAZOLAM HCL 5 MG/5ML IJ SOLN
INTRAMUSCULAR | Status: DC | PRN
  Administered 2020-11-23: 2 mg via INTRAVENOUS

## 2020-11-23 MED ORDER — LIDOCAINE HCL 1 % IJ SOLN
INTRAMUSCULAR | Status: DC | PRN
  Administered 2020-11-23: 20 mL

## 2020-11-23 MED ORDER — MIDAZOLAM HCL 2 MG/2ML IJ SOLN
INTRAMUSCULAR | Status: AC
  Filled 2020-11-23: qty 2

## 2020-11-23 MED ORDER — LACTATED RINGERS IV SOLN: INTRAVENOUS | Status: DC

## 2020-11-23 MED ORDER — GENTAMICIN SULFATE 40 MG/ML IJ SOLN
350.0000 mg | INTRAVENOUS | Status: AC
  Administered 2020-11-23: 350 mg via INTRAVENOUS
  Filled 2020-11-23: qty 8.75

## 2020-11-23 MED ORDER — FENTANYL CITRATE (PF) 100 MCG/2ML IJ SOLN: 25.0000 ug | INTRAMUSCULAR | Status: DC | PRN

## 2020-11-23 MED ORDER — FENTANYL CITRATE (PF) 100 MCG/2ML IJ SOLN
INTRAMUSCULAR | Status: AC
  Filled 2020-11-23: qty 2

## 2020-11-23 MED ORDER — KETOROLAC TROMETHAMINE 30 MG/ML IJ SOLN
INTRAMUSCULAR | Status: AC
  Filled 2020-11-23: qty 1

## 2020-11-23 MED ORDER — DEXAMETHASONE SODIUM PHOSPHATE 10 MG/ML IJ SOLN
INTRAMUSCULAR | Status: DC | PRN
  Administered 2020-11-23: 10 mg via INTRAVENOUS

## 2020-11-23 MED ORDER — POVIDONE-IODINE 10 % EX SWAB: 2.0000 "application " | Freq: Once | CUTANEOUS | Status: DC

## 2020-11-23 MED ORDER — SODIUM CHLORIDE 0.9 % IR SOLN
Status: DC | PRN
  Administered 2020-11-23: 3000 mL

## 2020-11-23 MED ORDER — CLINDAMYCIN PHOSPHATE 900 MG/50ML IV SOLN
900.0000 mg | INTRAVENOUS | Status: AC
  Administered 2020-11-23: 900 mg via INTRAVENOUS

## 2020-11-23 MED ORDER — ONDANSETRON HCL 4 MG/2ML IJ SOLN
INTRAMUSCULAR | Status: DC | PRN
  Administered 2020-11-23: 4 mg via INTRAVENOUS

## 2020-11-23 MED ORDER — LIDOCAINE 2% (20 MG/ML) 5 ML SYRINGE
INTRAMUSCULAR | Status: AC
  Filled 2020-11-23: qty 5

## 2020-11-23 MED ORDER — GLYCOPYRROLATE 0.2 MG/ML IJ SOLN
INTRAMUSCULAR | Status: DC | PRN
  Administered 2020-11-23: .1 mg via INTRAVENOUS

## 2020-11-23 MED ORDER — LIDOCAINE 2% (20 MG/ML) 5 ML SYRINGE
INTRAMUSCULAR | Status: DC | PRN
  Administered 2020-11-23: 100 mg via INTRAVENOUS

## 2020-11-23 MED ORDER — PROPOFOL 10 MG/ML IV BOLUS
INTRAVENOUS | Status: AC
  Filled 2020-11-23: qty 40

## 2020-11-23 MED ORDER — ONDANSETRON HCL 4 MG/2ML IJ SOLN: 4.0000 mg | Freq: Once | INTRAMUSCULAR | Status: DC | PRN

## 2020-11-23 MED ORDER — KETOROLAC TROMETHAMINE 30 MG/ML IJ SOLN
INTRAMUSCULAR | Status: DC | PRN
  Administered 2020-11-23: 30 mg via INTRAVENOUS

## 2020-11-23 MED ORDER — CLINDAMYCIN PHOSPHATE 900 MG/50ML IV SOLN
INTRAVENOUS | Status: AC
  Filled 2020-11-23: qty 50

## 2020-11-23 MED ORDER — EPHEDRINE SULFATE-NACL 50-0.9 MG/10ML-% IV SOSY
PREFILLED_SYRINGE | INTRAVENOUS | Status: DC | PRN
  Administered 2020-11-23: 5 mg via INTRAVENOUS

## 2020-11-23 MED ORDER — KETOROLAC TROMETHAMINE 30 MG/ML IJ SOLN: 30.0000 mg | Freq: Once | INTRAMUSCULAR | Status: DC | PRN

## 2020-11-23 MED ORDER — PROPOFOL 10 MG/ML IV BOLUS
INTRAVENOUS | Status: DC | PRN
  Administered 2020-11-23: 200 mg via INTRAVENOUS

## 2020-11-23 MED ORDER — SOD CITRATE-CITRIC ACID 500-334 MG/5ML PO SOLN: 30.0000 mL | ORAL | Status: DC

## 2020-11-23 MED ORDER — FENTANYL CITRATE (PF) 100 MCG/2ML IJ SOLN
INTRAMUSCULAR | Status: DC | PRN
  Administered 2020-11-23: 25 ug via INTRAVENOUS
  Administered 2020-11-23: 50 ug via INTRAVENOUS

## 2020-11-23 SURGICAL SUPPLY — 18 items
CATH ROBINSON RED A/P 16FR (CATHETERS) ×3 IMPLANT
DEVICE MYOSURE LITE (MISCELLANEOUS) ×1 IMPLANT
DEVICE MYOSURE REACH (MISCELLANEOUS) IMPLANT
DILATOR CANAL MILEX (MISCELLANEOUS) IMPLANT
ELECT REM PT RETURN 9FT ADLT (ELECTROSURGICAL)
ELECTRODE REM PT RTRN 9FT ADLT (ELECTROSURGICAL) IMPLANT
GAUZE 4X4 16PLY ~~LOC~~+RFID DBL (SPONGE) ×3 IMPLANT
GLOVE SURG ENC MOIS LTX SZ8 (GLOVE) ×3 IMPLANT
GOWN STRL REUS W/TWL LRG LVL3 (GOWN DISPOSABLE) ×3 IMPLANT
IV NS IRRIG 3000ML ARTHROMATIC (IV SOLUTION) ×7 IMPLANT
KIT PROCEDURE FLUENT (KITS) ×3 IMPLANT
KIT TURNOVER CYSTO (KITS) ×3 IMPLANT
PACK VAGINAL MINOR WOMEN LF (CUSTOM PROCEDURE TRAY) ×3 IMPLANT
PAD OB MATERNITY 4.3X12.25 (PERSONAL CARE ITEMS) ×3 IMPLANT
PAD PREP 24X48 CUFFED NSTRL (MISCELLANEOUS) ×3 IMPLANT
SEAL CERVICAL OMNI LOK (ABLATOR) IMPLANT
SEAL ROD LENS SCOPE MYOSURE (ABLATOR) ×3 IMPLANT
TOWEL OR 17X26 10 PK STRL BLUE (TOWEL DISPOSABLE) ×3 IMPLANT

## 2020-11-23 NOTE — Progress Notes (Signed)
11/23/2020  8:05 AM  PATIENT:  Heidi Mckee  65 y.o. female  PRE-OPERATIVE DIAGNOSIS:  POSTMENOPAUSAL BLEEDING  POST-OPERATIVE DIAGNOSIS:  POSTMENOPAUSAL BLEEDING  PROCEDURE:  Procedure(s): DILATATION & CURETTAGE/HYSTEROSCOPY WITH MYOSURE (N/A)  SURGEON:  Surgeon(s) and Role:    * Harold Hedge, MD - Primary  PHYSICIAN ASSISTANT:   ASSISTANTS: none   ANESTHESIA:   general  EBL:  5 mL   BLOOD ADMINISTERED:none  DRAINS: none   LOCAL MEDICATIONS USED:  LIDOCAINE  and Amount: 20 ml  SPECIMEN:  Source of Specimen:  endometrial currettings  DISPOSITION OF SPECIMEN:  PATHOLOGY  COUNTS:  YES  TOURNIQUET:  * No tourniquets in log *  DICTATION: .Other Dictation: Dictation Number [id number]  PLAN OF CARE: Discharge to home after PACU  PATIENT DISPOSITION:  PACU - hemodynamically stable.   Delay start of Pharmacological VTE agent (>24hrs) due to surgical blood loss or risk of bleeding: not applicable

## 2020-11-23 NOTE — Anesthesia Procedure Notes (Signed)
Procedure Name: LMA Insertion Date/Time: 11/23/2020 7:35 AM Performed by: Marny Lowenstein, CRNA Pre-anesthesia Checklist: Patient identified, Emergency Drugs available, Suction available and Patient being monitored Patient Re-evaluated:Patient Re-evaluated prior to induction Oxygen Delivery Method: Circle system utilized Preoxygenation: Pre-oxygenation with 100% oxygen Induction Type: IV induction Ventilation: Mask ventilation without difficulty LMA: LMA inserted LMA Size: 4.0 Number of attempts: 1 Placement Confirmation: positive ETCO2 and breath sounds checked- equal and bilateral Tube secured with: Tape Dental Injury: Teeth and Oropharynx as per pre-operative assessment

## 2020-11-23 NOTE — Op Note (Signed)
NAME: Heidi Mckee, AHLEAH SIMKO MEDICAL RECORD NO: [id number] ACCOUNT NO: [id number] DATE OF BIRTH: 1955/11/12 FACILITY: [facility] LOCATION: [facility] PHYSICIAN: Guy Sandifer. Arleta Creek, MD  Operative Report   DATE OF PROCEDURE: 11/23/2020   PREOPERATIVE DIAGNOSIS:  Postmenopausal bleeding.  POSTOPERATIVE DIAGNOSIS:  Postmenopausal bleeding.  PROCEDURES:  Hysteroscopy, dilation and curettage and MyoSure resection.  SURGEON:  Guy Sandifer. Arleta Creek, MD  ANESTHESIA:  General with LMA.  ESTIMATED BLOOD LOSS:  5 mL.  SPECIMENS:  Endometrial curettings to pathology.  I and O's of distending media, 55 mL deficit.  INDICATIONS AND CONSENT:  This patient is a 65 year old patient with postmenopausal bleeding.  Details are dictated in history and physical.  Hysteroscopy, D and C, possible MyoSure has been discussed.  Potential risks and complications have been  discussed preoperatively including but not limited to infection, uterine perforation, organ damage, bleeding requiring transfusion of blood products with HIV and hepatitis acquisition, DVT, PE, pneumonia and recurrent postmenopausal bleeding.  The  patient states she understands and agrees and consent signed on the chart.  FINDINGS: Both fallopian tube ostia were identified.  There are several small elevated nodular areas in the lower endometrial cavity.  DESCRIPTION OF PROCEDURE:  The patient was taken to the operating room where she is identified, placed in the dorsal supine position.  General anesthesia was induced via LMA.  She was placed in the dorsal lithotomy position.  Timeout was undertaken.  She  was prepped vaginally with Betadine.  Bladder was straight catheterized and draped in a sterile fashion.  Bivalve speculum was placed.  Anterior cervical lip was injected with 1% lidocaine and grasped with a single tooth tenaculum.  Paracervical block  was placed at 2, 4, 5, 7, 8 and 10 o'clock positions with approximately 20 mL of the same solution.  The  cervix was gently progressively dilated.  Hysteroscope was placed in the endocervical canal and advanced under direct visualization using distending  media.  The above findings were noted.  Hysteroscope was withdrawn.  Sharp curettage was carried out.  Reinspection reveals the bumps in the lower uterine segment are still there.  Therefore, MyoSure was used to resect these in a simple fashion.  Good  hemostasis is noted.  A final inspection reveals the cavity distend well and the cavity to be clean.  Sharp curettage has been carried out and all specimens were sent to pathology.  Instruments were removed.  Good hemostasis is noted.  All counts were  correct.  The patient was taken to the recovery room in stable condition.     [email or web address] D: 11/23/2020 8:12:35 am T: 11/23/2020 9:47:00 am  JOB: [id number]/ [id number]

## 2020-11-23 NOTE — Progress Notes (Signed)
No change to H&P per patient history Reviewed procedure-H/S, D&C, possible Myosure resection PO instructions discussed She states she understands and agrees

## 2020-11-23 NOTE — Anesthesia Postprocedure Evaluation (Signed)
Anesthesia Post Note  Patient: Heidi Mckee  Procedure(s) Performed: DILATATION & CURETTAGE/HYSTEROSCOPY WITH MYOSURE (Vagina )     Patient location during evaluation: PACU Anesthesia Type: General Level of consciousness: awake and alert Pain management: pain level controlled Vital Signs Assessment: post-procedure vital signs reviewed and stable Respiratory status: spontaneous breathing, nonlabored ventilation, respiratory function stable and patient connected to nasal cannula oxygen Cardiovascular status: blood pressure returned to baseline and stable Postop Assessment: no apparent nausea or vomiting Anesthetic complications: no   No notable events documented.  Last Vitals:  Vitals:   11/23/20 0815 11/23/20 0830  BP: 135/67 135/73  Pulse: 78 72  Resp: 13 12  Temp:    SpO2: 98% 100%    Last Pain:  Vitals:   11/23/20 0808  TempSrc:   PainSc: Asleep                 Biff Rutigliano S

## 2020-11-23 NOTE — Discharge Instructions (Addendum)

## 2020-11-23 NOTE — Transfer of Care (Signed)
Immediate Anesthesia Transfer of Care Note  Patient: [name]  Procedure(s) Performed: DILATATION & CURETTAGE/HYSTEROSCOPY WITH MYOSURE (Vagina )  Patient Location: PACU  Anesthesia Type:General  Level of Consciousness: drowsy  Airway & Oxygen Therapy: Patient Spontanous Breathing  Post-op Assessment: Report given to RN and Post -op Vital signs reviewed and stable  Post vital signs: Reviewed and stable  Last Vitals:  Vitals Value Taken Time  BP 133/62 11/23/20 0809  Temp    Pulse 71 11/23/20 0811  Resp 12 11/23/20 0811  SpO2 100 % 11/23/20 0811  Vitals shown include unvalidated device data.  Last Pain:  Vitals:   11/23/20 0610  TempSrc: Oral  PainSc: 0-No pain      Patients Stated Pain Goal: 5 (11/23/20 0610)  Complications: No notable events documented.

## 2020-11-26 ENCOUNTER — Encounter (HOSPITAL_BASED_OUTPATIENT_CLINIC_OR_DEPARTMENT_OTHER): Payer: Self-pay | Admitting: Obstetrics and Gynecology

## 2020-11-26 LAB — SURGICAL PATHOLOGY

## 2020-12-10 ENCOUNTER — Other Ambulatory Visit: Payer: Self-pay | Admitting: Family Medicine

## 2021-01-15 ENCOUNTER — Ambulatory Visit: Payer: 59 | Admitting: Neurology

## 2021-01-15 ENCOUNTER — Encounter: Payer: Self-pay | Admitting: Neurology

## 2021-01-15 VITALS — BP 168/75 | HR 77 | Ht 65.0 in | Wt 196.0 lb

## 2021-01-15 DIAGNOSIS — G5603 Carpal tunnel syndrome, bilateral upper limbs: Secondary | ICD-10-CM | POA: Diagnosis not present

## 2021-01-15 MED ORDER — GABAPENTIN 100 MG PO CAPS: 100.0000 mg | ORAL_CAPSULE | Freq: Three times a day (TID) | ORAL | 6 refills | Status: DC

## 2021-01-15 NOTE — Progress Notes (Signed)
Chief Complaint  Patient presents with   New Patient (Initial Visit)    Rm 14, alone  NX [facility] Internal referral for NCV/EMG carpal tunnel syndrome of right wrist  Pt c/o pain in both hands and shoulder, states pain wakes her up at night, numbness and weakness in both hands,     ASSESSMENT AND PLAN  Heidi Mckee is a 66 y.o. female  Bilateral hands paresthesia  Most consistent with bilateral carpal tunnel syndrome  EMG nerve conduction study  Gabapentin 100 mg 1-3 tablets nightly as needed,  Bilateral wrist splint   DIAGNOSTIC DATA (LABS, IMAGING, TESTING) - I reviewed patient records, labs, notes, testing and imaging myself where available.   MEDICAL HISTORY:  Heidi Mckee is a 66 years old right handed female, seen in request by her primary care physician Dr. Lynnea FerrierPickard, [place] for evaluation of bilateral hands paresthesia, initial evaluation was on January 15, 2021  I reviewed and summarized the referring note. PMHx.  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 Anne HahnWillis confirmed moderate right carpal tunnel, slight on the left side  She now retired, enjoying yard work, noticed increased right hand numbness, from intermittent become more consistent, wake her up almost every night, dropped things from her hands  She denies significant neck pain, denies gait abnormality, denies bowel bladder incontinence   PHYSICAL EXAM:   Vitals:   01/15/21 1441  BP: (!) 168/75  Pulse: 77  Weight: 196 lb (88.9 kg)  Height: 5\' 5"  (1.651 m)   Not recorded     Body mass index is 32.62 kg/m.  PHYSICAL EXAMNIATION:  Gen: NAD, conversant, well nourised, well groomed                     Cardiovascular: Regular rate rhythm, no peripheral edema, warm, nontender. Eyes: Conjunctivae  clear without exudates or hemorrhage Neck: Supple, no carotid bruits. Pulmonary: Clear to auscultation bilaterally   NEUROLOGICAL EXAM:  MENTAL STATUS: Speech:    Speech is normal; fluent and spontaneous with normal comprehension.  Cognition:     Orientation to time, place and person     Normal recent and remote memory     Normal Attention span and concentration     Normal Language, naming, repeating,spontaneous speech     Fund of knowledge   CRANIAL NERVES: CN II: Visual fields are full to confrontation. Pupils are round equal and briskly reactive to light. CN III, IV, VI: extraocular movement are normal. No ptosis. CN V: Facial sensation is intact to light touch CN VII: Face is symmetric with normal eye closure  CN VIII: Hearing is normal to causal conversation. CN IX, X: Phonation is normal. CN XI: Head turning and shoulder shrug are intact  MOTOR: She has no bilateral abductor pollicis brevis, opponens weakness, no grip weakness  REFLEXES: Reflexes are 2+ and symmetric at the biceps, triceps, knees, and ankles. Plantar responses are flexor.  SENSORY: Decreased light touch at bilateral first 3 finger tips,  COORDINATION: There is no trunk or limb dysmetria noted.  GAIT/STANCE: Posture is normal. Gait is steady with normal steps, base, arm swing, and turning. Heel and toe walking are normal. Tandem gait is normal.  Romberg is absent.  REVIEW OF SYSTEMS:  Full 14 system review of  systems performed and notable only for as above All other review of systems were negative.   ALLERGIES: Allergies  Allergen Reactions   Sulfa Antibiotics Anaphylaxis   Penicillins     Unknown reaction - was told d/t reaction sulfa drugs    HOME MEDICATIONS: Current Outpatient Medications  Medication Sig Dispense Refill   Biotin 10 MG CAPS biotin 10,000 mcg capsule   1 capsule every day by oral route.     Cholecalciferol 25 MCG (1000 UT) tablet Vitamin D3 25 mcg (1,000 unit) tablet    1 tablet every day by oral route.     Magnesium 200 MG TABS 1 tablet daily     Multiple Vitamin (MULTIVITAMIN WITH MINERALS) TABS tablet Take 1 tablet by mouth daily.     omega-3 fish oil (MAXEPA) 1000 MG CAPS capsule 2 capsules.     No current facility-administered medications for this visit.    PAST MEDICAL HISTORY: Past Medical History:  Diagnosis Date   Bleeds easily (HCC)    per patient 11/20/2020 Pt states that when she was younger she had frequent nosebleeds. She states that it takes longer for her blood to clot. She did require a transfusion after a CSection but doesn't know why. She states that it has gotten better as she has gotten older. She states that she has never been diagnosed with any disorder.   Blood transfusion without reported diagnosis 1986   Carpal tunnel syndrome    Carpal tunnel syndrome of right wrist 05/12/2017   Heart murmur    MVP 34 yrs ago   HOH (hard of hearing) 11/20/2020   Patient has bilateral hearing aids.   Hx of migraines    11/20/20 pt states that she had migraines in her teens and 20's   UTI (urinary tract infection)     PAST SURGICAL HISTORY: Past Surgical History:  Procedure Laterality Date   CESAREAN SECTION  1986   COLONOSCOPY     10 yrs ago   COLONOSCOPY  10/19/2019   DILATATION & CURETTAGE/HYSTEROSCOPY WITH MYOSURE N/A 11/23/2020   Procedure: DILATATION & CURETTAGE/HYSTEROSCOPY WITH MYOSURE;  Surgeon: Harold Hedge, MD;  Location: [facility];  Service: Gynecology;  Laterality: N/A;    FAMILY HISTORY: Family History  Problem Relation Age of Onset   Dementia Mother    Stroke Father    Colon cancer Neg Hx    Colon polyps Neg Hx    Esophageal cancer Neg Hx    Rectal cancer Neg Hx    Stomach cancer Neg Hx     SOCIAL HISTORY: Social History   Socioeconomic History   Marital status: Married    Spouse name: Not on file   Number of children: Not on file   Years of education: Not on file   Highest education  level: Not on file  Occupational History   Not on file  Tobacco Use   Smoking status: Former    Packs/day: 0.50    Years: 40.00    Pack years: 20.00    Types: Cigarettes    Quit date: 07/07/2015    Years since quitting: 5.5   Smokeless tobacco: Never  Vaping Use   Vaping Use: Every day   Substances: Nicotine, Flavoring  Substance and Sexual Activity   Alcohol use: Yes    Comment: Occasional   Drug use: Never   Sexual activity: Not on file  Other Topics Concern   Not on file  Social History Narrative   Not on file  Social Determinants of Health   Financial Resource Strain: Not on file  Food Insecurity: Not on file  Transportation Needs: Not on file  Physical Activity: Not on file  Stress: Not on file  Social Connections: Not on file  Intimate Partner Violence: Not on file      Levert Feinstein, M.D. Ph.D.  [facility] [address] [place], [place] [address] Ph: [phone] Fax: [phone]  CC:  Donita Brooks, MD [address] [place],  [place] [address]  Donita Brooks, MD

## 2021-01-15 NOTE — Addendum Note (Signed)
Addended by: Levert Feinstein on: 01/15/2021 05:48 PM   Modules accepted: Level of Service

## 2021-03-13 ENCOUNTER — Encounter: Payer: 59 | Admitting: Neurology

## 2021-03-13 ENCOUNTER — Telehealth: Payer: Self-pay

## 2021-03-13 MED ORDER — PANTOPRAZOLE SODIUM 40 MG PO TBEC: 40.0000 mg | DELAYED_RELEASE_TABLET | Freq: Every day | ORAL | 3 refills | Status: DC

## 2021-03-13 NOTE — Telephone Encounter (Signed)
Rx sent to pharmacy   

## 2021-03-13 NOTE — Telephone Encounter (Signed)
Pharmacy faxed refill request for  ? ?pantoprazole (PROTONIX) 40 MG tablet [[id number]]  DISCONTINUED ?  Order Details ?Dose, Route, Frequency: As Directed  ?Dispense Quantity: 30 tablet Refills: 3   ?     ?Sig: TAKE ONE TABLET BY MOUTH DAILY  ?     ?Start Date: 12/10/20 End Date: 01/15/21  ?Discontinued by: Rosezella [place], CMA on 01/15/2021 14:43  ? ?

## 2021-03-14 ENCOUNTER — Ambulatory Visit (INDEPENDENT_AMBULATORY_CARE_PROVIDER_SITE_OTHER): Payer: 59 | Admitting: Neurology

## 2021-03-14 ENCOUNTER — Ambulatory Visit: Payer: 59 | Admitting: Neurology

## 2021-03-14 DIAGNOSIS — Z0289 Encounter for other administrative examinations: Secondary | ICD-10-CM

## 2021-03-14 DIAGNOSIS — G5603 Carpal tunnel syndrome, bilateral upper limbs: Secondary | ICD-10-CM | POA: Diagnosis not present

## 2021-03-18 ENCOUNTER — Encounter: Payer: Self-pay | Admitting: Neurology

## 2021-03-20 ENCOUNTER — Encounter: Payer: Self-pay | Admitting: Family Medicine

## 2021-03-25 ENCOUNTER — Encounter: Payer: Self-pay | Admitting: Neurology

## 2021-04-06 ENCOUNTER — Encounter: Payer: Self-pay | Admitting: Neurology

## 2021-04-09 ENCOUNTER — Encounter: Payer: Self-pay | Admitting: Neurology

## 2021-04-09 NOTE — Procedures (Signed)
? ? ? ?   ?Full Name: Heidi Mckee Gender: Female ?MRN #: [id number] Date of Birth: 13-Nov-1955 ?   ?Visit Date: 03/14/2021 15:49 ?Age: 66 Years ?Examining Physician: Levert Feinstein, MD  ?Referring Physician: Levert Feinstein, MD ?Height: 5 feet 5 inch ?Patient History: 66 year old female, complains of bilateral hands paresthesia. ? ?Summary of the test: ? ?Nerve conduction study: ?Bilateral ulnar sensory responses were normal.  Bilateral median sensory response showed mild to moderately prolonged peak latency with decreased snap amplitude, right worse than left. ? ?Right ulnar and left median motor responses were normal.   ?Right median motor response showed mildly prolonged distal latency, with mildly decreased CMAP amplitude. ? ?Electromyography: ? ?Selected needle examinations were performed at right upper extremity muscles.  The only abnormality noted is mildly decreased recruitment at right abductor pollicis brevis, there is no spontaneous activity. ? ?Conclusion: ?This is an abnormal study.  There is electrodiagnostic evidence of bilateral median neuropathy across the wrist, consistent with bilateral carpal tunnel syndromes, right side is moderate; left side is mild, demyelinating in nature.  There is no evidence of axonal loss. ? ? ? ?------------------------------- ?Levert Feinstein, M.D. PhD ? ?[facility] ?[address], [address] ?[place], [place] [address] ?Tel: [phone] ?Fax: [phone] ? ?Verbal informed consent was obtained from the patient, patient was informed of potential risk of procedure, including bruising, bleeding, hematoma formation, infection, muscle weakness, muscle pain, numbness, among others. ?   ? ?   ?MNC ?   ?Nerve / Sites Muscle Latency Ref. Amplitude Ref. Rel Amp Segments Distance Velocity Ref. Area  ?  ms ms mV mV %  cm m/s m/s mVms  ?L Median - APB  ?   Wrist APB 4.1 ?4.4 7.3 ?4.0 100 Wrist - APB 7   32.4  ?   Upper arm APB 7.6  7.0  96.1 Upper arm - Wrist 20 57 ?49 30.1  ?R Median  - APB  ?   Wrist APB 4.7 ?4.4 3.6 ?4.0 100 Wrist - APB 7   14.1  ?   Upper arm APB 8.4  3.2  88.9 Upper arm - Wrist 19 51 ?49 12.2  ?R Ulnar - ADM  ?   Wrist ADM 2.8 ?3.3 5.5 ?6.0 100 Wrist - ADM 7   19.3  ?   B.Elbow ADM 5.2  5.7  103 B.Elbow - Wrist 16 67 ?49 21.2  ?   A.Elbow ADM 7.1  6.9  121 A.Elbow - B.Elbow 12 63 ?49 26.4  ?         ?SNC ?   ?Nerve / Sites Rec. Site Peak Lat Ref.  Amp Ref. Segments Distance  ?  ms ms ?V ?V  cm  ?L Median - Orthodromic (Dig II, Mid palm)  ?   Dig II Wrist 4.0 ?3.4 9 ?10 Dig II - Wrist 13  ?R Median - Orthodromic (Dig II, Mid palm)  ?   Dig II Wrist 4.6 ?3.4 5 ?10 Dig II - Wrist 13  ?L Ulnar - Orthodromic, (Dig V, Mid palm)  ?   Dig V Wrist 2.9 ?3.1 6 ?5 Dig V - Wrist 11  ?R Ulnar - Orthodromic, (Dig V, Mid palm)  ?   Dig V Wrist 2.6 ?3.1 6 ?5 Dig V - Wrist 11  ?           ?F  Wave ?   ?Nerve F Lat Ref.  ? ms ms  ?R Median - APB 29.8 ?31.0  ?  R Ulnar - ADM 25.8 ?32.0  ?       ?EMG Summary Table   ? Spontaneous MUAP Recruitment  ?Muscle IA Fib PSW Fasc Other Amp Dur. Poly Pattern  ?R. First dorsal interosseous Normal None None None _______ Normal Normal Normal Normal  ?R. Abductor pollicis brevis Normal None None None _______ Normal Normal Normal Reduced  ?R. Pronator teres Normal None None None _______ Normal Normal Normal Normal  ?R. Biceps brachii Normal None None None _______ Normal Normal Normal Normal  ?R. Deltoid Normal None None None _______ Normal Normal Normal Normal  ?R. Brachioradialis Normal None None None _______ Normal Normal Normal Normal  ? ?  ?

## 2021-04-09 NOTE — Progress Notes (Signed)
EMG nerve conduction study report is under procedure ?

## 2021-04-11 IMAGING — CR DG LUMBAR SPINE COMPLETE 4+V
5 series · 5 of 5 positions shown · non-contrast
Comparison: None.

CLINICAL DATA: Chronic low back pain.

EXAM:
LUMBAR SPINE - COMPLETE 4+ VIEW

[t l-spine a.p.]
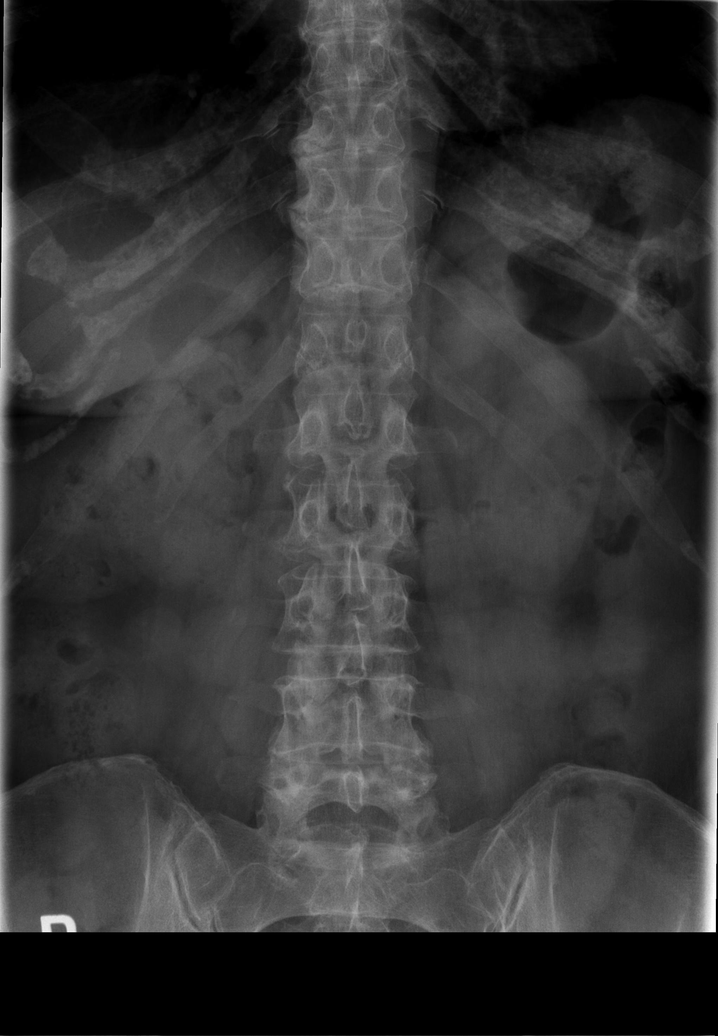

[t l-spine oblique exposure (1 of 2)]
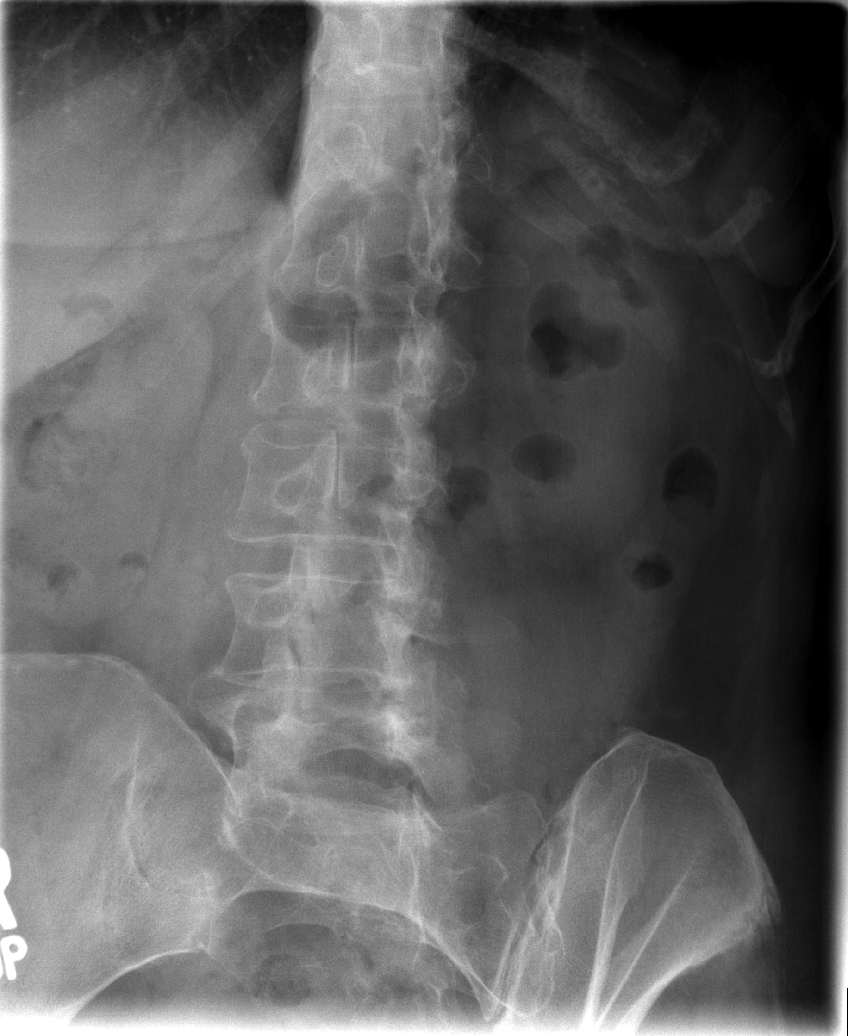

[t l-spine oblique exposure (2 of 2)]
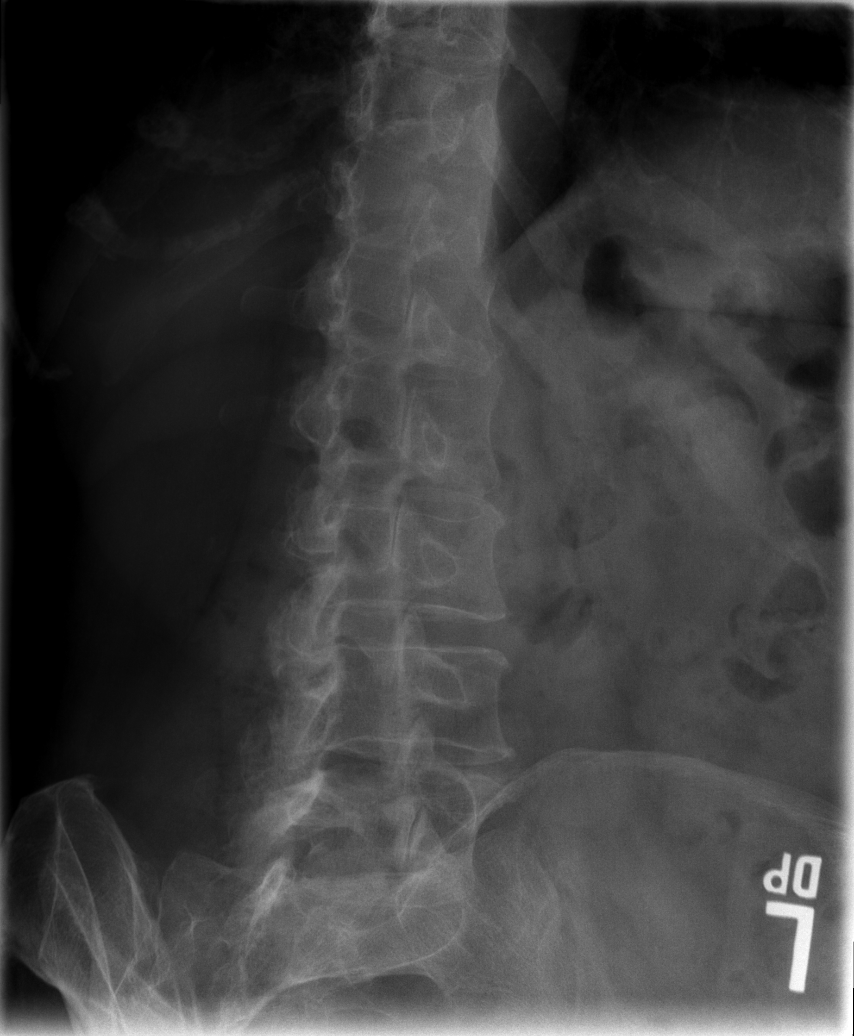

[t l-spine lat]
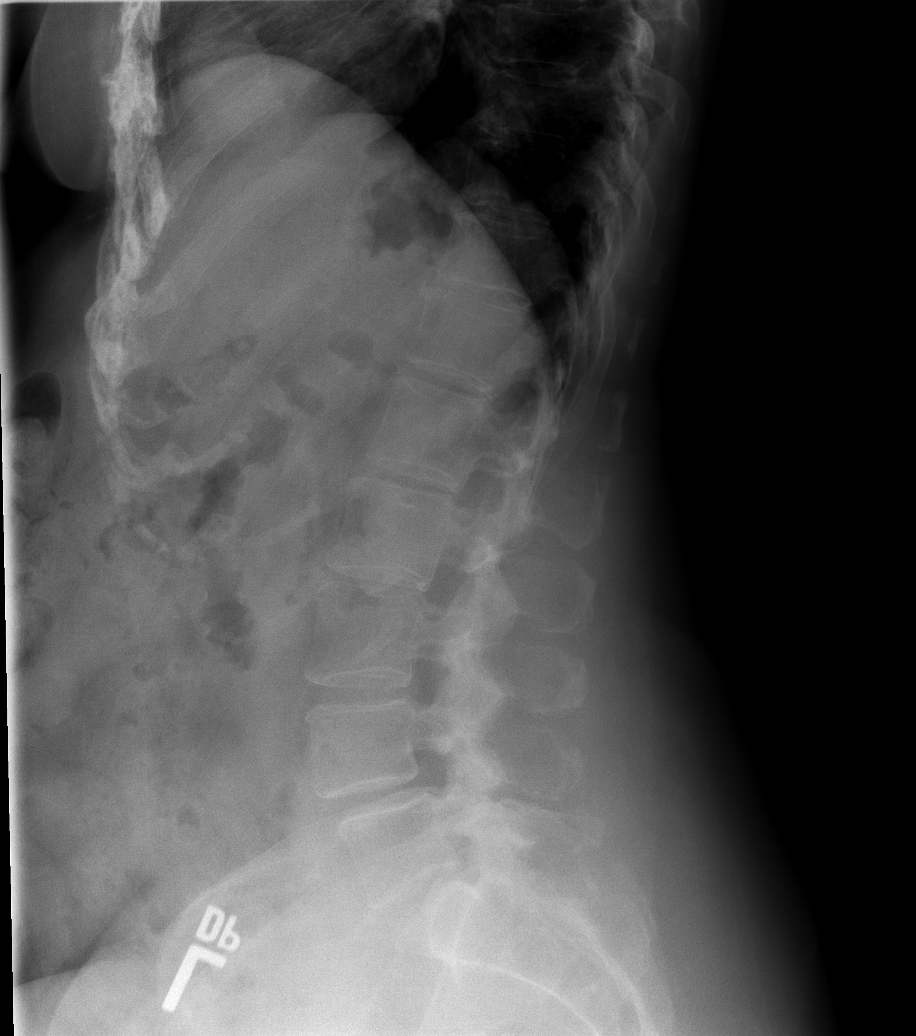

[t l-spine l5-s1 spot]
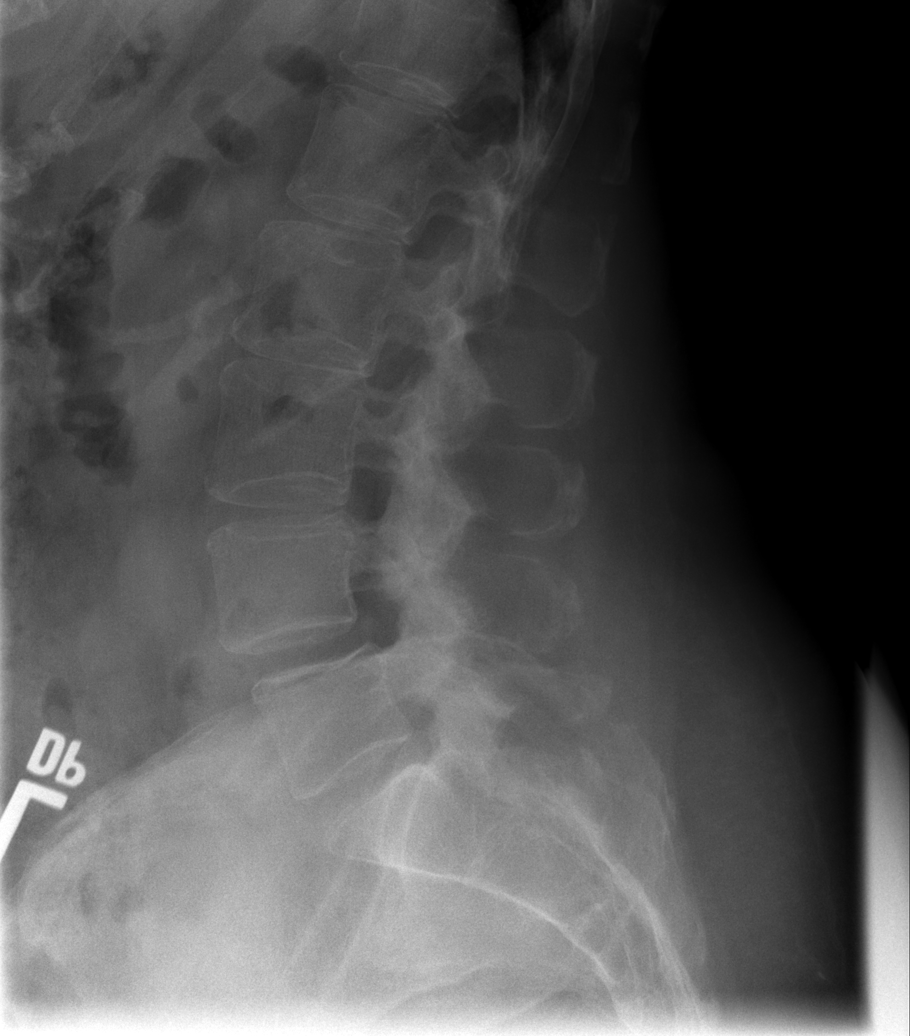

[5 of 5 positions shown; findings below may reference images not displayed]

FINDINGS: Minimal grade 1 anterolisthesis of L4-5 is noted secondary to
posterior facet joint hypertrophy. No fracture is noted. Disc spaces
are well-maintained.
IMPRESSION: Minimal grade 1 anterolisthesis of L4-5 is noted secondary to
posterior facet joint hypertrophy. No acute abnormality seen in the
lumbar spine.

## 2021-09-04 DIAGNOSIS — L723 Sebaceous cyst: Secondary | ICD-10-CM | POA: Diagnosis not present

## 2021-09-04 DIAGNOSIS — D485 Neoplasm of uncertain behavior of skin: Secondary | ICD-10-CM | POA: Diagnosis not present

## 2021-09-04 DIAGNOSIS — L821 Other seborrheic keratosis: Secondary | ICD-10-CM | POA: Diagnosis not present

## 2021-09-04 DIAGNOSIS — B078 Other viral warts: Secondary | ICD-10-CM | POA: Diagnosis not present

## 2021-10-22 ENCOUNTER — Encounter: Payer: Self-pay | Admitting: Family Medicine

## 2021-10-24 DIAGNOSIS — N952 Postmenopausal atrophic vaginitis: Secondary | ICD-10-CM | POA: Diagnosis not present

## 2021-10-24 DIAGNOSIS — Z01419 Encounter for gynecological examination (general) (routine) without abnormal findings: Secondary | ICD-10-CM | POA: Diagnosis not present

## 2021-10-24 DIAGNOSIS — Z6833 Body mass index (BMI) 33.0-33.9, adult: Secondary | ICD-10-CM | POA: Diagnosis not present

## 2021-10-24 DIAGNOSIS — Z1231 Encounter for screening mammogram for malignant neoplasm of breast: Secondary | ICD-10-CM | POA: Diagnosis not present

## 2021-10-24 DIAGNOSIS — Z124 Encounter for screening for malignant neoplasm of cervix: Secondary | ICD-10-CM | POA: Diagnosis not present

## 2021-10-24 LAB — HM MAMMOGRAPHY

## 2021-10-28 ENCOUNTER — Other Ambulatory Visit: Payer: Self-pay | Admitting: Family Medicine

## 2021-10-28 NOTE — Telephone Encounter (Signed)
Pt needs physical w/pcp for future refills, this is a courtesy refill

## 2021-11-18 ENCOUNTER — Other Ambulatory Visit: Payer: 59

## 2021-11-18 DIAGNOSIS — Z0000 Encounter for general adult medical examination without abnormal findings: Secondary | ICD-10-CM | POA: Diagnosis not present

## 2021-11-18 DIAGNOSIS — Z13220 Encounter for screening for lipoid disorders: Secondary | ICD-10-CM

## 2021-11-18 LAB — CBC WITH DIFFERENTIAL/PLATELET
Absolute Monocytes: 480 cells/uL (ref 200–950)
Basophils Absolute: 48 cells/uL (ref 0–200)
Basophils Relative: 0.8 %
Eosinophils Absolute: 138 cells/uL (ref 15–500)
Eosinophils Relative: 2.3 %
HCT: 43.7 % (ref 35.0–45.0)
Hemoglobin: 14.5 g/dL (ref 11.7–15.5)
Lymphs Abs: 1740 cells/uL (ref 850–3900)
MCH: 29.1 pg (ref 27.0–33.0)
MCHC: 33.2 g/dL (ref 32.0–36.0)
MCV: 87.8 fL (ref 80.0–100.0)
MPV: 9.2 fL (ref 7.5–12.5)
Monocytes Relative: 8 %
Neutro Abs: 3594 cells/uL (ref 1500–7800)
Neutrophils Relative %: 59.9 %
Platelets: 328 10*3/uL (ref 140–400)
RBC: 4.98 10*6/uL (ref 3.80–5.10)
RDW: 12.7 % (ref 11.0–15.0)
Total Lymphocyte: 29 %
WBC: 6 10*3/uL (ref 3.8–10.8)

## 2021-11-18 LAB — COMPLETE METABOLIC PANEL WITH GFR
AG Ratio: 1.5 (calc) (ref 1.0–2.5)
ALT: 17 U/L (ref 6–29)
AST: 16 U/L (ref 10–35)
Albumin: 4.3 g/dL (ref 3.6–5.1)
Alkaline phosphatase (APISO): 39 U/L (ref 37–153)
BUN: 18 mg/dL (ref 7–25)
CO2: 30 mmol/L (ref 20–32)
Calcium: 9.6 mg/dL (ref 8.6–10.4)
Chloride: 103 mmol/L (ref 98–110)
Creat: 0.71 mg/dL (ref 0.50–1.05)
Globulin: 2.9 g/dL (calc) (ref 1.9–3.7)
Glucose, Bld: 100 mg/dL — ABNORMAL HIGH (ref 65–99)
Potassium: 4.8 mmol/L (ref 3.5–5.3)
Sodium: 138 mmol/L (ref 135–146)
Total Bilirubin: 0.4 mg/dL (ref 0.2–1.2)
Total Protein: 7.2 g/dL (ref 6.1–8.1)
eGFR: 95 mL/min/{1.73_m2} (ref >=60)

## 2021-11-18 LAB — LIPID PANEL
Cholesterol: 267 mg/dL — ABNORMAL HIGH (ref <200)
HDL: 70 mg/dL (ref >=50)
LDL Cholesterol (Calc): 175 mg/dL (calc) — ABNORMAL HIGH
Non-HDL Cholesterol (Calc): 197 mg/dL (calc) — ABNORMAL HIGH (ref <130)
Total CHOL/HDL Ratio: 3.8 (calc) (ref <5.0)
Triglycerides: 103 mg/dL (ref <150)

## 2021-11-21 ENCOUNTER — Ambulatory Visit (INDEPENDENT_AMBULATORY_CARE_PROVIDER_SITE_OTHER): Payer: 59 | Admitting: Family Medicine

## 2021-11-21 ENCOUNTER — Encounter: Payer: Self-pay | Admitting: Family Medicine

## 2021-11-21 VITALS — BP 126/70 | HR 87 | Ht 65.0 in | Wt 200.0 lb

## 2021-11-21 DIAGNOSIS — Z0000 Encounter for general adult medical examination without abnormal findings: Secondary | ICD-10-CM | POA: Diagnosis not present

## 2021-11-21 DIAGNOSIS — E7800 Pure hypercholesterolemia, unspecified: Secondary | ICD-10-CM | POA: Diagnosis not present

## 2021-11-21 NOTE — Progress Notes (Signed)
Subjective:     Patient ID: Heidi Mckee, female   DOB: [birth year], 66 y.o.   MRN: [id number]  HPI Patient is a very pleasant 66 year old Caucasian female who is here today for a physical exam.  Patient colonoscopy in 2020 and therefore this is up-to-date.  She had a Pap smear and a mammogram earlier this year at her gynecologist and this is up-to-date.  She states that she has had a bone density test through her gynecologist that was normal and is not due again for 2 years.  She had a flu shot.  She is due for the RSV and COVID-vaccine.  She refuses these today.  Her most recent lab work is listed below Lab on 11/18/2021  Component Date Value Ref Range Status   WBC 11/18/2021 6.0  3.8 - 10.8 Thousand/uL Final   RBC 11/18/2021 4.98  3.80 - 5.10 Million/uL Final   Hemoglobin 11/18/2021 14.5  11.7 - 15.5 g/dL Final   HCT 11/18/2021 43.7  35.0 - 45.0 % Final   MCV 11/18/2021 87.8  80.0 - 100.0 fL Final   MCH 11/18/2021 29.1  27.0 - 33.0 pg Final   MCHC 11/18/2021 33.2  32.0 - 36.0 g/dL Final   RDW 11/18/2021 12.7  11.0 - 15.0 % Final   Platelets 11/18/2021 328  140 - 400 Thousand/uL Final   MPV 11/18/2021 9.2  7.5 - 12.5 fL Final   Neutro Abs 11/18/2021 3,594  1,500 - 7,800 cells/uL Final   Lymphs Abs 11/18/2021 1,740  850 - 3,900 cells/uL Final   Absolute Monocytes 11/18/2021 480  200 - 950 cells/uL Final   Eosinophils Absolute 11/18/2021 138  15 - 500 cells/uL Final   Basophils Absolute 11/18/2021 48  0 - 200 cells/uL Final   Neutrophils Relative % 11/18/2021 59.9  % Final   Total Lymphocyte 11/18/2021 29.0  % Final   Monocytes Relative 11/18/2021 8.0  % Final   Eosinophils Relative 11/18/2021 2.3  % Final   Basophils Relative 11/18/2021 0.8  % Final   Glucose, Bld 11/18/2021 100 (H)  65 - 99 mg/dL Final   Comment: .            Fasting reference interval . For someone without known diabetes, a glucose value between 100 and 125 mg/dL is consistent with prediabetes and should be  confirmed with a follow-up test. .    BUN 11/18/2021 18  7 - 25 mg/dL Final   Creat 11/18/2021 0.71  0.50 - 1.05 mg/dL Final   eGFR 11/18/2021 95  > OR = 60 mL/min/1.33m Final   BUN/Creatinine Ratio 11/18/2021 SEE NOTE:  6 - 22 (calc) Final   Comment:    Not Reported: BUN and Creatinine are within    reference range. .    Sodium 11/18/2021 138  135 - 146 mmol/L Final   Potassium 11/18/2021 4.8  3.5 - 5.3 mmol/L Final   Chloride 11/18/2021 103  98 - 110 mmol/L Final   CO2 11/18/2021 30  20 - 32 mmol/L Final   Calcium 11/18/2021 9.6  8.6 - 10.4 mg/dL Final   Total Protein 11/18/2021 7.2  6.1 - 8.1 g/dL Final   Albumin 11/18/2021 4.3  3.6 - 5.1 g/dL Final   Globulin 11/18/2021 2.9  1.9 - 3.7 g/dL (calc) Final   AG Ratio 11/18/2021 1.5  1.0 - 2.5 (calc) Final   Total Bilirubin 11/18/2021 0.4  0.2 - 1.2 mg/dL Final   Alkaline phosphatase (APISO) 11/18/2021 39  37 - 153  U/L Final   AST 11/18/2021 16  10 - 35 U/L Final   ALT 11/18/2021 17  6 - 29 U/L Final   Cholesterol 11/18/2021 267 (H)  <200 mg/dL Final   HDL 11/18/2021 70  > OR = 50 mg/dL Final   Triglycerides 11/18/2021 103  <150 mg/dL Final   LDL Cholesterol (Calc) 11/18/2021 175 (H)  mg/dL (calc) Final   Comment: Reference range: <100 . Desirable range <100 mg/dL for primary prevention;   <70 mg/dL for patients with CHD or diabetic patients  with > or = 2 CHD risk factors. Marland Kitchen LDL-C is now calculated using the Martin-Hopkins  calculation, which is a validated novel method providing  better accuracy than the Friedewald equation in the  estimation of LDL-C.  Cresenciano Genre et al. Annamaria Helling. 0347;425(95): 2061-2068  (http://education.[facility].com/faq/FAQ164)    Total CHOL/HDL Ratio 11/18/2021 3.8  <5.0 (calc) Final   Non-HDL Cholesterol (Calc) 11/18/2021 197 (H)  <130 mg/dL (calc) Final   Comment: For patients with diabetes plus 1 major ASCVD risk  factor, treating to a non-HDL-C goal of <100 mg/dL  (LDL-C of <70 mg/dL) is  considered a therapeutic  option.     Past Medical History:  Diagnosis Date   Bleeds easily [facility])    per patient 11/20/2020 Pt states that when she was younger she had frequent nosebleeds. She states that it takes longer for her blood to clot. She did require a transfusion after a CSection but doesn't know why. She states that it has gotten better as she has gotten older. She states that she has never been diagnosed with any disorder.   Blood transfusion without reported diagnosis 1986   Carpal tunnel syndrome    Carpal tunnel syndrome of right wrist 05/12/2017   Heart murmur    MVP 34 yrs ago   HOH (hard of hearing) 11/20/2020   Patient has bilateral hearing aids.   Hx of migraines    11/20/20 pt states that she had migraines in her teens and 20's   UTI (urinary tract infection)    Current Outpatient Medications on File Prior to Visit  Medication Sig Dispense Refill   Cholecalciferol 25 MCG (1000 UT) tablet Vitamin D3 25 mcg (1,000 unit) tablet   1 tablet every day by oral route.     gabapentin (NEURONTIN) 100 MG capsule Take 1 capsule (100 mg total) by mouth 3 (three) times daily. 90 capsule 6   Magnesium 200 MG TABS 1 tablet daily     Multiple Vitamin (MULTIVITAMIN WITH MINERALS) TABS tablet Take 1 tablet by mouth daily.     omega-3 fish oil (MAXEPA) 1000 MG CAPS capsule 2 capsules.     pantoprazole (PROTONIX) 40 MG tablet TAKE ONE TABLET BY MOUTH DAILY 30 tablet 0   No current facility-administered medications on file prior to visit.   Allergies  Allergen Reactions   Sulfa Antibiotics Anaphylaxis   Penicillins     Unknown reaction - was told d/t reaction sulfa drugs   Social History   Socioeconomic History   Marital status: Married    Spouse name: Not on file   Number of children: Not on file   Years of education: Not on file   Highest education level: Not on file  Occupational History   Not on file  Tobacco Use   Smoking status: Former    Packs/day: 0.50     Years: 40.00    Total pack years: 20.00    Types: Cigarettes    Quit  date: 07/07/2015    Years since quitting: 6.3   Smokeless tobacco: Never  Vaping Use   Vaping Use: Every day   Substances: Nicotine, Flavoring  Substance and Sexual Activity   Alcohol use: Yes    Comment: Occasional   Drug use: Never   Sexual activity: Not on file  Other Topics Concern   Not on file  Social History Narrative   Not on file   Social Determinants of Health   Financial Resource Strain: Not on file  Food Insecurity: Not on file  Transportation Needs: Not on file  Physical Activity: Not on file  Stress: Not on file  Social Connections: Not on file  Intimate Partner Violence: Not on file   Family History  Problem Relation Age of Onset   Dementia Mother    Stroke Father    Colon cancer Neg Hx    Colon polyps Neg Hx    Esophageal cancer Neg Hx    Rectal cancer Neg Hx    Stomach cancer Neg Hx   Mother died from vascular dementia.  She does have a family history of strokes.  Review of Systems  All other systems reviewed and are negative.      Objective:   Physical Exam Vitals reviewed.  Constitutional:      General: She is not in acute distress.    Appearance: She is well-developed. She is not diaphoretic.  HENT:     Head: Normocephalic and atraumatic.     Right Ear: External ear normal.     Left Ear: External ear normal.     Nose: Nose normal.     Mouth/Throat:     Pharynx: No oropharyngeal exudate.  Eyes:     General: No scleral icterus.       Right eye: No discharge.        Left eye: No discharge.     Conjunctiva/sclera: Conjunctivae normal.     Pupils: Pupils are equal, round, and reactive to light.  Neck:     Thyroid: No thyromegaly.     Vascular: No JVD.     Trachea: No tracheal deviation.  Cardiovascular:     Rate and Rhythm: Normal rate and regular rhythm.     Heart sounds: Normal heart sounds. No murmur heard.    No friction rub. No gallop.  Pulmonary:     Effort:  Pulmonary effort is normal. No respiratory distress.     Breath sounds: Normal breath sounds. No stridor. No wheezing or rales.  Chest:     Chest wall: No tenderness.  Abdominal:     General: Bowel sounds are normal. There is no distension.     Palpations: Abdomen is soft. There is no mass.     Tenderness: There is no abdominal tenderness. There is no guarding or rebound.  Musculoskeletal:        General: No tenderness or deformity. Normal range of motion.     Cervical back: Normal range of motion.  Lymphadenopathy:     Cervical: No cervical adenopathy.  Skin:    General: Skin is warm.     Coloration: Skin is not pale.     Findings: No erythema or rash.  Neurological:     Mental Status: She is alert and oriented to person, place, and time.     Cranial Nerves: No cranial nerve deficit.     Motor: No abnormal muscle tone.     Coordination: Coordination normal.     Deep Tendon Reflexes: Reflexes are  normal and symmetric. Reflexes normal.  Psychiatric:        Behavior: Behavior normal.        Thought Content: Thought content normal.        Judgment: Judgment normal.        Assessment:   General medical exam  Pure hypercholesterolemia  Plan:   Colonoscopy, mammogram, and bone density are up-to-date.  Defer Pap smear to her gynecologist.  Recommended COVID and RSV vaccine.  Lab work shows hyperlipidemia.  Calculate 10-year risk of cardiovascular disease to be 5%.  Patient denies any significant family history of premature cardiovascular disease.  We discussed a coronary calcium score but the patient elects not to take a statin.  She will work on diet exercise and weight loss and then recheck her cholesterol in 6 months

## 2021-11-26 ENCOUNTER — Other Ambulatory Visit: Payer: Self-pay | Admitting: Family Medicine

## 2021-12-05 ENCOUNTER — Encounter: Payer: Self-pay | Admitting: Family Medicine

## 2022-01-04 ENCOUNTER — Other Ambulatory Visit: Payer: Self-pay | Admitting: Family Medicine

## 2022-01-04 NOTE — Telephone Encounter (Signed)
Requested Prescriptions  Pending Prescriptions Disp Refills   pantoprazole (PROTONIX) 40 MG tablet [Pharmacy Med Name: PANTOPRAZOLE SOD DR 40 MG TAB] 90 tablet 3    Sig: TAKE 1 TABLET BY MOUTH DAILY     Gastroenterology: Proton Pump Inhibitors Failed - 01/04/2022  6:52 AM      Failed - Valid encounter within last 12 months    Recent Outpatient Visits           1 year ago Carpal tunnel syndrome of right wrist   [facility] Donita Brooks, MD   1 year ago General medical exam   [facility] Donita Brooks, MD   2 years ago Abdominal pain, epigastric   [facility] Tanya Nones, Priscille Heidelberg, MD   2 years ago Carpal tunnel syndrome of right wrist   [facility] Pickard, Priscille Heidelberg, MD   2 years ago Dysuria   [facility] Pickard, Priscille Heidelberg, MD       Future Appointments             In 10 months Pickard, Priscille Heidelberg, MD [facility]

## 2022-01-24 ENCOUNTER — Encounter: Payer: Self-pay | Admitting: Family Medicine

## 2022-02-17 ENCOUNTER — Encounter: Payer: Self-pay | Admitting: Family Medicine

## 2022-02-17 ENCOUNTER — Telehealth: Payer: Self-pay

## 2022-02-17 NOTE — Telephone Encounter (Signed)
My Chart Message from patient:  I've been stuffy and feeling pressure primarily on one side for about 3 weeks now.  In the last few days, that eye and upper face (left) is swollen and doesn't go down with cold compresses.  Benedryl helped with congestion  yesterday, but it makes me very sleepy

## 2022-03-07 ENCOUNTER — Other Ambulatory Visit: Payer: Self-pay | Admitting: Family Medicine

## 2022-03-07 MED ORDER — AZITHROMYCIN 250 MG PO TABS: ORAL_TABLET | ORAL | 0 refills | Status: DC

## 2022-04-02 DIAGNOSIS — H2511 Age-related nuclear cataract, right eye: Secondary | ICD-10-CM | POA: Diagnosis not present

## 2022-04-02 DIAGNOSIS — H2513 Age-related nuclear cataract, bilateral: Secondary | ICD-10-CM | POA: Diagnosis not present

## 2022-04-02 DIAGNOSIS — H18413 Arcus senilis, bilateral: Secondary | ICD-10-CM | POA: Diagnosis not present

## 2022-04-02 DIAGNOSIS — H04123 Dry eye syndrome of bilateral lacrimal glands: Secondary | ICD-10-CM | POA: Diagnosis not present

## 2022-04-02 DIAGNOSIS — H25043 Posterior subcapsular polar age-related cataract, bilateral: Secondary | ICD-10-CM | POA: Diagnosis not present

## 2022-05-06 DIAGNOSIS — H2511 Age-related nuclear cataract, right eye: Secondary | ICD-10-CM | POA: Diagnosis not present

## 2022-05-06 DIAGNOSIS — H269 Unspecified cataract: Secondary | ICD-10-CM | POA: Diagnosis not present

## 2022-05-06 DIAGNOSIS — H2512 Age-related nuclear cataract, left eye: Secondary | ICD-10-CM | POA: Diagnosis not present

## 2022-05-13 DIAGNOSIS — H269 Unspecified cataract: Secondary | ICD-10-CM | POA: Diagnosis not present

## 2022-05-13 DIAGNOSIS — H2512 Age-related nuclear cataract, left eye: Secondary | ICD-10-CM | POA: Diagnosis not present

## 2022-05-20 DIAGNOSIS — H2512 Age-related nuclear cataract, left eye: Secondary | ICD-10-CM | POA: Diagnosis not present

## 2022-09-09 DIAGNOSIS — M7130 Other bursal cyst, unspecified site: Secondary | ICD-10-CM | POA: Diagnosis not present

## 2022-09-09 DIAGNOSIS — D225 Melanocytic nevi of trunk: Secondary | ICD-10-CM | POA: Diagnosis not present

## 2022-09-09 DIAGNOSIS — L820 Inflamed seborrheic keratosis: Secondary | ICD-10-CM | POA: Diagnosis not present

## 2022-09-09 DIAGNOSIS — L821 Other seborrheic keratosis: Secondary | ICD-10-CM | POA: Diagnosis not present

## 2022-09-09 DIAGNOSIS — L738 Other specified follicular disorders: Secondary | ICD-10-CM | POA: Diagnosis not present

## 2022-09-09 DIAGNOSIS — L723 Sebaceous cyst: Secondary | ICD-10-CM | POA: Diagnosis not present

## 2022-10-03 ENCOUNTER — Encounter: Payer: Self-pay | Admitting: Family Medicine

## 2022-10-07 ENCOUNTER — Ambulatory Visit (INDEPENDENT_AMBULATORY_CARE_PROVIDER_SITE_OTHER): Payer: Medicare HMO | Admitting: Family Medicine

## 2022-10-07 ENCOUNTER — Encounter: Payer: Self-pay | Admitting: Family Medicine

## 2022-10-07 VITALS — BP 124/82 | HR 92 | Temp 98.7°F | Ht 65.0 in | Wt 197.0 lb

## 2022-10-07 DIAGNOSIS — J329 Chronic sinusitis, unspecified: Secondary | ICD-10-CM

## 2022-10-07 DIAGNOSIS — J029 Acute pharyngitis, unspecified: Secondary | ICD-10-CM

## 2022-10-07 MED ORDER — HYDROCOD POLI-CHLORPHE POLI ER 10-8 MG/5ML PO SUER: 5.0000 mL | Freq: Two times a day (BID) | ORAL | 0 refills | Status: DC | PRN

## 2022-10-07 MED ORDER — AZITHROMYCIN 250 MG PO TABS: ORAL_TABLET | ORAL | 0 refills | Status: DC

## 2022-10-07 NOTE — Progress Notes (Signed)
Subjective:    Patient ID: Heidi Mckee, female    DOB: 1955-11-06, 67 y.o.   MRN: [id number]   Patient presents today complaining of sinus pain.  She had an upper respiratory infection that started on September 7 almost 4 weeks ago.  Today she reports more than a week of pain and pressure in both frontal sinuses in both maxillary sinuses.  She reports postnasal drip, constant nonproductive cough, and a dull headache.  She states that she feels like her head will explode every time she coughs.  Symptoms present over a week.  However she is dealing with diarrhea and cough and scratchy throat for almost a month.  Past Medical History:  Diagnosis Date   Bleeds easily [facility])    per patient 11/20/2020 Pt states that when she was younger she had frequent nosebleeds. She states that it takes longer for her blood to clot. She did require a transfusion after a CSection but doesn't know why. She states that it has gotten better as she has gotten older. She states that she has never been diagnosed with any disorder.   Blood transfusion without reported diagnosis 1986   Carpal tunnel syndrome    Carpal tunnel syndrome of right wrist 05/12/2017   Heart murmur    MVP 34 yrs ago   HOH (hard of hearing) 11/20/2020   Patient has bilateral hearing aids.   Hx of migraines    11/20/20 pt states that she had migraines in her teens and 20's   UTI (urinary tract infection)    Past Surgical History:  Procedure Laterality Date   CESAREAN SECTION  1986   COLONOSCOPY     10 yrs ago   COLONOSCOPY  10/19/2019   DILATATION & CURETTAGE/HYSTEROSCOPY WITH MYOSURE N/A 11/23/2020   Procedure: DILATATION & CURETTAGE/HYSTEROSCOPY WITH MYOSURE;  Surgeon: Harold Hedge, MD;  Location: [facility];  Service: Gynecology;  Laterality: N/A;   Current Outpatient Medications on File Prior to Visit  Medication Sig Dispense Refill   Cholecalciferol 25 MCG (1000 UT) tablet Vitamin D3 25 mcg (1,000 unit) tablet    1 tablet every day by oral route.     gabapentin (NEURONTIN) 100 MG capsule Take 1 capsule (100 mg total) by mouth 3 (three) times daily. 90 capsule 6   Magnesium 200 MG TABS 1 tablet daily     Multiple Vitamin (MULTIVITAMIN WITH MINERALS) TABS tablet Take 1 tablet by mouth daily.     omega-3 fish oil (MAXEPA) 1000 MG CAPS capsule 2 capsules.     pantoprazole (PROTONIX) 40 MG tablet TAKE 1 TABLET BY MOUTH DAILY 90 tablet 3   No current facility-administered medications on file prior to visit.    Allergies  Allergen Reactions   Sulfa Antibiotics Anaphylaxis   Penicillins     Unknown reaction - was told d/t reaction sulfa drugs   Social History   Socioeconomic History   Marital status: Married    Spouse name: Not on file   Number of children: Not on file   Years of education: Not on file   Highest education level: Bachelor's degree (e.g., BA, AB, BS)  Occupational History   Not on file  Tobacco Use   Smoking status: Former    Current packs/day: 0.00    Average packs/day: 0.5 packs/day for 40.0 years (20.0 ttl pk-yrs)    Types: Cigarettes    Start date: 07/07/1975    Quit date: 07/07/2015    Years since quitting: 7.2  Smokeless tobacco: Never  Vaping Use   Vaping status: Every Day   Substances: Nicotine, Flavoring  Substance and Sexual Activity   Alcohol use: Yes    Comment: Occasional   Drug use: Never   Sexual activity: Not on file  Other Topics Concern   Not on file  Social History Narrative   Not on file   Social Determinants of Health   Financial Resource Strain: Low Risk  (10/06/2022)   Overall Financial Resource Strain (CARDIA)    Difficulty of Paying Living Expenses: Not hard at all  Food Insecurity: No Food Insecurity (10/06/2022)   Hunger Vital Sign    Worried About Running Out of Food in the Last Year: Never true    Ran Out of Food in the Last Year: Never true  Transportation Needs: No Transportation Needs (10/06/2022)   PRAPARE - Scientist, research (physical sciences) (Medical): No    Lack of Transportation (Non-Medical): No  Physical Activity: Sufficiently Active (10/06/2022)   Exercise Vital Sign    Days of Exercise per Week: 4 days    Minutes of Exercise per Session: 60 min  Stress: No Stress Concern Present (10/06/2022)   [employer] of Occupational Health - Occupational Stress Questionnaire    Feeling of Stress : Only a little  Social Connections: Socially Integrated (10/06/2022)   Social Connection and Isolation Panel [NHANES]    Frequency of Communication with Friends and Family: More than three times a week    Frequency of Social Gatherings with Friends and Family: More than three times a week    Attends Religious Services: More than 4 times per year    Active Member of [employer] or Organizations: Yes    Attends Engineer, structural: More than 4 times per year    Marital Status: Married  Catering manager Violence: Not on file   Family History  Problem Relation Age of Onset   Dementia Mother    Stroke Father    Colon cancer Neg Hx    Colon polyps Neg Hx    Esophageal cancer Neg Hx    Rectal cancer Neg Hx    Stomach cancer Neg Hx      Review of Systems  Gastrointestinal:  Positive for abdominal pain.  All other systems reviewed and are negative.      Objective:   Physical Exam Vitals reviewed.  Constitutional:      General: She is not in acute distress.    Appearance: Normal appearance. She is normal weight. She is not ill-appearing, toxic-appearing or diaphoretic.  HENT:     Right Ear: Tympanic membrane and ear canal normal. There is no impacted cerumen.     Left Ear: Tympanic membrane and ear canal normal. There is no impacted cerumen.     Nose: Congestion and rhinorrhea present.     Right Turbinates: Swollen.     Left Turbinates: Swollen.     Right Sinus: Maxillary sinus tenderness and frontal sinus tenderness present.     Left Sinus: Maxillary sinus tenderness and frontal sinus tenderness present.      Mouth/Throat:     Pharynx: No posterior oropharyngeal erythema.  Eyes:     Conjunctiva/sclera: Conjunctivae normal.  Cardiovascular:     Rate and Rhythm: Normal rate and regular rhythm.     Pulses: Normal pulses.     Heart sounds: Normal heart sounds. No murmur heard.    No friction rub. No gallop.  Pulmonary:     Effort: Pulmonary  effort is normal. No respiratory distress.     Breath sounds: Normal breath sounds. No stridor. No wheezing, rhonchi or rales.  Chest:     Chest wall: No tenderness.  Abdominal:     Palpations: There is no hepatomegaly or splenomegaly.  Musculoskeletal:     Cervical back: Normal range of motion and neck supple. No rigidity.  Lymphadenopathy:     Cervical: No cervical adenopathy.  Skin:    Coloration: Skin is not jaundiced or pale.     Findings: No bruising, erythema, lesion or rash.  Neurological:     General: No focal deficit present.     Mental Status: She is alert and oriented to person, place, and time. Mental status is at baseline.     Sensory: No sensory deficit.     Motor: No weakness.     Coordination: Coordination normal.     Gait: Gait normal.  Psychiatric:        Mood and Affect: Mood normal.        Behavior: Behavior normal.        Thought Content: Thought content normal.        Judgment: Judgment normal.           Assessment & Plan:  Sore throat - Plan: STREP GROUP A AG, W/REFLEX TO CULT  Rhinosinusitis Strep test negative.  I believe the sore throat is due to drainage.  I believe the cough is due to drainage.  I believe the patient has developed a secondary sinus infection.  I will treat the patient with a Z-Pak and give her Tussionex 1 teaspoon every 12 hours as needed for cough.

## 2022-10-10 ENCOUNTER — Other Ambulatory Visit: Payer: Self-pay | Admitting: Family Medicine

## 2022-10-10 ENCOUNTER — Encounter: Payer: Self-pay | Admitting: Family Medicine

## 2022-10-15 ENCOUNTER — Other Ambulatory Visit: Payer: Self-pay | Admitting: Family Medicine

## 2022-10-15 MED ORDER — PREDNISONE 20 MG PO TABS: ORAL_TABLET | ORAL | 0 refills | Status: DC

## 2022-10-15 MED ORDER — LEVOFLOXACIN 500 MG PO TABS: 500.0000 mg | ORAL_TABLET | Freq: Every day | ORAL | 0 refills | Status: AC

## 2022-11-03 DIAGNOSIS — Z1231 Encounter for screening mammogram for malignant neoplasm of breast: Secondary | ICD-10-CM | POA: Diagnosis not present

## 2022-11-03 DIAGNOSIS — Z6833 Body mass index (BMI) 33.0-33.9, adult: Secondary | ICD-10-CM | POA: Diagnosis not present

## 2022-11-03 DIAGNOSIS — Z01419 Encounter for gynecological examination (general) (routine) without abnormal findings: Secondary | ICD-10-CM | POA: Diagnosis not present

## 2022-11-06 LAB — HM MAMMOGRAPHY

## 2022-11-11 ENCOUNTER — Encounter: Payer: Self-pay | Admitting: Family Medicine

## 2022-11-20 ENCOUNTER — Other Ambulatory Visit: Payer: Medicare HMO

## 2022-11-20 DIAGNOSIS — E7800 Pure hypercholesterolemia, unspecified: Secondary | ICD-10-CM | POA: Diagnosis not present

## 2022-11-20 DIAGNOSIS — Z0000 Encounter for general adult medical examination without abnormal findings: Secondary | ICD-10-CM | POA: Diagnosis not present

## 2022-11-20 DIAGNOSIS — J329 Chronic sinusitis, unspecified: Secondary | ICD-10-CM

## 2022-11-20 DIAGNOSIS — G5603 Carpal tunnel syndrome, bilateral upper limbs: Secondary | ICD-10-CM

## 2022-11-21 LAB — COMPLETE METABOLIC PANEL WITH GFR
AG Ratio: 1.6 (calc) (ref 1.0–2.5)
ALT: 23 U/L (ref 6–29)
AST: 21 U/L (ref 10–35)
Albumin: 4.3 g/dL (ref 3.6–5.1)
Alkaline phosphatase (APISO): 35 U/L — ABNORMAL LOW (ref 37–153)
BUN: 15 mg/dL (ref 7–25)
CO2: 28 mmol/L (ref 20–32)
Calcium: 9.5 mg/dL (ref 8.6–10.4)
Chloride: 105 mmol/L (ref 98–110)
Creat: 0.61 mg/dL (ref 0.50–1.05)
Globulin: 2.7 g/dL (ref 1.9–3.7)
Glucose, Bld: 90 mg/dL (ref 65–99)
Potassium: 4.7 mmol/L (ref 3.5–5.3)
Sodium: 140 mmol/L (ref 135–146)
Total Bilirubin: 0.3 mg/dL (ref 0.2–1.2)
Total Protein: 7 g/dL (ref 6.1–8.1)
eGFR: 99 mL/min/{1.73_m2} (ref >=60)

## 2022-11-21 LAB — CBC WITH DIFFERENTIAL/PLATELET
Absolute Lymphocytes: 1860 {cells}/uL (ref 850–3900)
Absolute Monocytes: 546 {cells}/uL (ref 200–950)
Basophils Absolute: 37 {cells}/uL (ref 0–200)
Basophils Relative: 0.6 %
Eosinophils Absolute: 99 {cells}/uL (ref 15–500)
Eosinophils Relative: 1.6 %
HCT: 43.5 % (ref 35.0–45.0)
Hemoglobin: 14.3 g/dL (ref 11.7–15.5)
MCH: 28.8 pg (ref 27.0–33.0)
MCHC: 32.9 g/dL (ref 32.0–36.0)
MCV: 87.7 fL (ref 80.0–100.0)
MPV: 9.6 fL (ref 7.5–12.5)
Monocytes Relative: 8.8 %
Neutro Abs: 3658 {cells}/uL (ref 1500–7800)
Neutrophils Relative %: 59 %
Platelets: 355 10*3/uL (ref 140–400)
RBC: 4.96 10*6/uL (ref 3.80–5.10)
RDW: 13.5 % (ref 11.0–15.0)
Total Lymphocyte: 30 %
WBC: 6.2 10*3/uL (ref 3.8–10.8)

## 2022-11-21 LAB — LIPID PANEL
Cholesterol: 234 mg/dL — ABNORMAL HIGH (ref <200)
HDL: 73 mg/dL (ref >=50)
LDL Cholesterol (Calc): 141 mg/dL — ABNORMAL HIGH
Non-HDL Cholesterol (Calc): 161 mg/dL — ABNORMAL HIGH (ref <130)
Total CHOL/HDL Ratio: 3.2 (calc) (ref <5.0)
Triglycerides: 94 mg/dL (ref <150)

## 2022-11-24 ENCOUNTER — Encounter: Payer: Self-pay | Admitting: Family Medicine

## 2022-11-24 ENCOUNTER — Ambulatory Visit (INDEPENDENT_AMBULATORY_CARE_PROVIDER_SITE_OTHER): Payer: Medicare HMO | Admitting: Family Medicine

## 2022-11-24 VITALS — BP 124/82 | HR 79 | Temp 98.6°F | Ht 65.0 in | Wt 201.5 lb

## 2022-11-24 DIAGNOSIS — Z23 Encounter for immunization: Secondary | ICD-10-CM | POA: Diagnosis not present

## 2022-11-24 DIAGNOSIS — Z0000 Encounter for general adult medical examination without abnormal findings: Secondary | ICD-10-CM

## 2022-11-24 DIAGNOSIS — Z122 Encounter for screening for malignant neoplasm of respiratory organs: Secondary | ICD-10-CM

## 2022-11-24 DIAGNOSIS — Z0001 Encounter for general adult medical examination with abnormal findings: Secondary | ICD-10-CM

## 2022-11-24 DIAGNOSIS — E7800 Pure hypercholesterolemia, unspecified: Secondary | ICD-10-CM

## 2022-11-24 DIAGNOSIS — Z87891 Personal history of nicotine dependence: Secondary | ICD-10-CM

## 2022-11-24 NOTE — Progress Notes (Signed)
Subjective:     Patient ID: Heidi Mckee, female   DOB: [birth year], 67 y.o.   MRN: [id number]  HPI Patient is a very pleasant 67 year old Caucasian female who is here today for a physical exam.  Patient colonoscopy in 2021 and therefore this is up-to-date.  Patient had her mammogram through her gynecologist.  This is up-to-date.  Her bone density is also up-to-date.  She does have a history of smoking.  She smoked more than 20 years.  She states she quit less than 10 years ago.  Therefore she is due for lung cancer screening.  She is due for a flu shot which she received today.  She is also due for a COVID-vaccine and RSV shot.  She is also due for Pneumovax 23. Lab on 11/20/2022  Component Date Value Ref Range Status   WBC 11/20/2022 6.2  3.8 - 10.8 Thousand/uL Final   RBC 11/20/2022 4.96  3.80 - 5.10 Million/uL Final   Hemoglobin 11/20/2022 14.3  11.7 - 15.5 g/dL Final   HCT 78/29/5621 43.5  35.0 - 45.0 % Final   MCV 11/20/2022 87.7  80.0 - 100.0 fL Final   MCH 11/20/2022 28.8  27.0 - 33.0 pg Final   MCHC 11/20/2022 32.9  32.0 - 36.0 g/dL Final   Comment: For adults, a slight decrease in the calculated MCHC value (in the range of 30 to 32 g/dL) is most likely not clinically significant; however, it should be interpreted with caution in correlation with other red cell parameters and the patient's clinical condition.    RDW 11/20/2022 13.5  11.0 - 15.0 % Final   Platelets 11/20/2022 355  140 - 400 Thousand/uL Final   MPV 11/20/2022 9.6  7.5 - 12.5 fL Final   Neutro Abs 11/20/2022 3,658  1,500 - 7,800 cells/uL Final   Absolute Lymphocytes 11/20/2022 1,860  850 - 3,900 cells/uL Final   Absolute Monocytes 11/20/2022 546  200 - 950 cells/uL Final   Eosinophils Absolute 11/20/2022 99  15 - 500 cells/uL Final   Basophils Absolute 11/20/2022 37  0 - 200 cells/uL Final   Neutrophils Relative % 11/20/2022 59  % Final   Total Lymphocyte 11/20/2022 30.0  % Final   Monocytes Relative 11/20/2022  8.8  % Final   Eosinophils Relative 11/20/2022 1.6  % Final   Basophils Relative 11/20/2022 0.6  % Final   Glucose, Bld 11/20/2022 90  65 - 99 mg/dL Final   Comment: .            Fasting reference interval .    BUN 11/20/2022 15  7 - 25 mg/dL Final   Creat 30/86/5784 0.61  0.50 - 1.05 mg/dL Final   eGFR 69/62/9528 99  > OR = 60 mL/min/1.74m2 Final   BUN/Creatinine Ratio 11/20/2022 SEE NOTE:  6 - 22 (calc) Final   Comment:    Not Reported: BUN and Creatinine are within    reference range. .    Sodium 11/20/2022 140  135 - 146 mmol/L Final   Potassium 11/20/2022 4.7  3.5 - 5.3 mmol/L Final   Chloride 11/20/2022 105  98 - 110 mmol/L Final   CO2 11/20/2022 28  20 - 32 mmol/L Final   Calcium 11/20/2022 9.5  8.6 - 10.4 mg/dL Final   Total Protein 41/32/4401 7.0  6.1 - 8.1 g/dL Final   Albumin 02/72/5366 4.3  3.6 - 5.1 g/dL Final   Globulin 44/03/4740 2.7  1.9 - 3.7 g/dL (calc) Final   AG Ratio  11/20/2022 1.6  1.0 - 2.5 (calc) Final   Total Bilirubin 11/20/2022 0.3  0.2 - 1.2 mg/dL Final   Alkaline phosphatase (APISO) 11/20/2022 35 (L)  37 - 153 U/L Final   AST 11/20/2022 21  10 - 35 U/L Final   ALT 11/20/2022 23  6 - 29 U/L Final   Cholesterol 11/20/2022 234 (H)  <200 mg/dL Final   HDL 24/40/1027 73  > OR = 50 mg/dL Final   Triglycerides 25/36/6440 94  <150 mg/dL Final   LDL Cholesterol (Calc) 11/20/2022 141 (H)  mg/dL (calc) Final   Comment: Reference range: <100 . Desirable range <100 mg/dL for primary prevention;   <70 mg/dL for patients with CHD or diabetic patients  with > or = 2 CHD risk factors. Marland Kitchen LDL-C is now calculated using the Martin-Hopkins  calculation, which is a validated novel method providing  better accuracy than the Friedewald equation in the  estimation of LDL-C.  Horald Pollen et al. Lenox Ahr. 3474;259(56): 2061-2068  (http://education.[facility].com/faq/FAQ164)    Total CHOL/HDL Ratio 11/20/2022 3.2  <3.8 (calc) Final   Non-HDL Cholesterol (Calc)  11/20/2022 161 (H)  <130 mg/dL (calc) Final   Comment: For patients with diabetes plus 1 major ASCVD risk  factor, treating to a non-HDL-C goal of <100 mg/dL  (LDL-C of <75 mg/dL) is considered a therapeutic  option.   Abstract on 11/11/2022  Component Date Value Ref Range Status   HM Mammogram 11/06/2022 0-4 Bi-Rad  0-4 Bi-Rad, Self Reported Normal Final   HM Dexa Scan 10/17/2020 10/17/2020   Final   Normal    Past Medical History:  Diagnosis Date   Bleeds easily (HCC)    per patient 11/20/2020 Pt states that when she was younger she had frequent nosebleeds. She states that it takes longer for her blood to clot. She did require a transfusion after a CSection but doesn't know why. She states that it has gotten better as she has gotten older. She states that she has never been diagnosed with any disorder.   Blood transfusion without reported diagnosis 1986   Carpal tunnel syndrome    Carpal tunnel syndrome of right wrist 05/12/2017   Heart murmur    MVP 34 yrs ago   HOH (hard of hearing) 11/20/2020   Patient has bilateral hearing aids.   Hx of migraines    11/20/20 pt states that she had migraines in her teens and 20's   UTI (urinary tract infection)    Current Outpatient Medications on File Prior to Visit  Medication Sig Dispense Refill   chlorpheniramine-HYDROcodone (TUSSIONEX) 10-8 MG/5ML Take 5 mLs by mouth every 12 (twelve) hours as needed. 120 mL 0   Cholecalciferol 25 MCG (1000 UT) tablet Vitamin D3 25 mcg (1,000 unit) tablet   1 tablet every day by oral route.     gabapentin (NEURONTIN) 100 MG capsule Take 1 capsule (100 mg total) by mouth 3 (three) times daily. 90 capsule 6   Magnesium 200 MG TABS 1 tablet daily     Multiple Vitamin (MULTIVITAMIN WITH MINERALS) TABS tablet Take 1 tablet by mouth daily.     omega-3 fish oil (MAXEPA) 1000 MG CAPS capsule 2 capsules.     pantoprazole (PROTONIX) 40 MG tablet TAKE 1 TABLET BY MOUTH DAILY 90 tablet 3   azithromycin (ZITHROMAX)  250 MG tablet 2 tabs poqday1, 1 tab poqday 2-5 (Patient not taking: Reported on 11/24/2022) 6 tablet 0   predniSONE (DELTASONE) 20 MG tablet 3 tabs poqday 1-2, 2  tabs poqday 3-4, 1 tab poqday 5-6 (Patient not taking: Reported on 11/24/2022) 12 tablet 0   No current facility-administered medications on file prior to visit.   Allergies  Allergen Reactions   Sulfa Antibiotics Anaphylaxis   Penicillins     Unknown reaction - was told d/t reaction sulfa drugs   Social History   Socioeconomic History   Marital status: Married    Spouse name: Not on file   Number of children: Not on file   Years of education: Not on file   Highest education level: Bachelor's degree (e.g., BA, AB, BS)  Occupational History   Not on file  Tobacco Use   Smoking status: Former    Current packs/day: 0.00    Average packs/day: 0.5 packs/day for 40.0 years (20.0 ttl pk-yrs)    Types: Cigarettes    Start date: 07/07/1975    Quit date: 07/07/2015    Years since quitting: 7.3   Smokeless tobacco: Never  Vaping Use   Vaping status: Every Day   Substances: Nicotine, Flavoring  Substance and Sexual Activity   Alcohol use: Yes    Comment: Occasional   Drug use: Never   Sexual activity: Not on file  Other Topics Concern   Not on file  Social History Narrative   Not on file   Social Determinants of Health   Financial Resource Strain: Low Risk  (10/06/2022)   Overall Financial Resource Strain (CARDIA)    Difficulty of Paying Living Expenses: Not hard at all  Food Insecurity: No Food Insecurity (10/06/2022)   Hunger Vital Sign    Worried About Running Out of Food in the Last Year: Never true    Ran Out of Food in the Last Year: Never true  Transportation Needs: No Transportation Needs (10/06/2022)   PRAPARE - Administrator, Civil Service (Medical): No    Lack of Transportation (Non-Medical): No  Physical Activity: Sufficiently Active (10/06/2022)   Exercise Vital Sign    Days of Exercise per  Week: 4 days    Minutes of Exercise per Session: 60 min  Stress: No Stress Concern Present (10/06/2022)   [employer] of Occupational Health - Occupational Stress Questionnaire    Feeling of Stress : Only a little  Social Connections: Socially Integrated (10/06/2022)   Social Connection and Isolation Panel [NHANES]    Frequency of Communication with Friends and Family: More than three times a week    Frequency of Social Gatherings with Friends and Family: More than three times a week    Attends Religious Services: More than 4 times per year    Active Member of [employer] or Organizations: Yes    Attends Engineer, structural: More than 4 times per year    Marital Status: Married  Catering manager Violence: Not on file   Family History  Problem Relation Age of Onset   Dementia Mother    Stroke Father    Colon cancer Neg Hx    Colon polyps Neg Hx    Esophageal cancer Neg Hx    Rectal cancer Neg Hx    Stomach cancer Neg Hx   Mother died from vascular dementia.  She does have a family history of strokes.  Review of Systems  All other systems reviewed and are negative.      Objective:   Physical Exam Vitals reviewed.  Constitutional:      General: She is not in acute distress.    Appearance: She is well-developed. She is  not diaphoretic.  HENT:     Head: Normocephalic and atraumatic.     Right Ear: External ear normal.     Left Ear: External ear normal.     Nose: Nose normal.     Mouth/Throat:     Pharynx: No oropharyngeal exudate.  Eyes:     General: No scleral icterus.       Right eye: No discharge.        Left eye: No discharge.     Conjunctiva/sclera: Conjunctivae normal.     Pupils: Pupils are equal, round, and reactive to light.  Neck:     Thyroid: No thyromegaly.     Vascular: No JVD.     Trachea: No tracheal deviation.  Cardiovascular:     Rate and Rhythm: Normal rate and regular rhythm.     Heart sounds: Normal heart sounds. No murmur heard.    No  friction rub. No gallop.  Pulmonary:     Effort: Pulmonary effort is normal. No respiratory distress.     Breath sounds: Normal breath sounds. No stridor. No wheezing or rales.  Chest:     Chest wall: No tenderness.  Abdominal:     General: Bowel sounds are normal. There is no distension.     Palpations: Abdomen is soft. There is no mass.     Tenderness: There is no abdominal tenderness. There is no guarding or rebound.  Musculoskeletal:        General: No tenderness or deformity. Normal range of motion.     Cervical back: Normal range of motion.  Lymphadenopathy:     Cervical: No cervical adenopathy.  Skin:    General: Skin is warm.     Coloration: Skin is not pale.     Findings: No erythema or rash.  Neurological:     Mental Status: She is alert and oriented to person, place, and time.     Cranial Nerves: No cranial nerve deficit.     Motor: No abnormal muscle tone.     Coordination: Coordination normal.     Deep Tendon Reflexes: Reflexes are normal and symmetric. Reflexes normal.  Psychiatric:        Behavior: Behavior normal.        Thought Content: Thought content normal.        Judgment: Judgment normal.       Assessment:   Needs flu shot - Plan: Flu Vaccine Trivalent High Dose (Fluad)  Screening for lung cancer - Plan: CT CHEST LUNG CA SCREEN LOW DOSE W/O CM  Need for pneumococcal vaccine - Plan: Pneumococcal polysaccharide vaccine 23-valent greater than or equal to 2yo subcutaneous/IM  General medical exam  History of tobacco abuse  Pure hypercholesterolemia   Plan:  Patient received her flu shot today.  She also received Pneumovax 23.  I recommended the COVID-vaccine.  Also recommended RSV as well.  Patient declines these 2 vaccines.  History of smoking and tobacco abuse I will screen the patient for lung cancer with a CT scan.  The rest of her lab work looks well except for her cholesterol.  Cholesterol is still extremely high.  Patient declines a statin.   However if there is atherosclerosis on CT scan, the patient would concede to taking statin for risk factor reduction.  Blood pressure is well-controlled today.  Mammogram and colonoscopy are up-to-date.

## 2022-11-27 ENCOUNTER — Encounter: Payer: Self-pay | Admitting: Family Medicine

## 2022-12-08 ENCOUNTER — Ambulatory Visit
Admission: RE | Admit: 2022-12-08 | Discharge: 2022-12-08 | Disposition: A | Discharge status: Home / Self Care | Payer: Medicare HMO | Source: Ambulatory Visit | Attending: Family Medicine | Admitting: Family Medicine

## 2022-12-08 DIAGNOSIS — Z122 Encounter for screening for malignant neoplasm of respiratory organs: Secondary | ICD-10-CM

## 2022-12-08 DIAGNOSIS — Z87891 Personal history of nicotine dependence: Secondary | ICD-10-CM | POA: Diagnosis not present

## 2023-01-02 ENCOUNTER — Ambulatory Visit (INDEPENDENT_AMBULATORY_CARE_PROVIDER_SITE_OTHER): Payer: Medicare HMO | Admitting: Family Medicine

## 2023-01-02 ENCOUNTER — Encounter: Payer: Self-pay | Admitting: Family Medicine

## 2023-01-02 VITALS — BP 122/84 | HR 71 | Temp 98.0°F | Ht 65.0 in | Wt 199.4 lb

## 2023-01-02 DIAGNOSIS — G5601 Carpal tunnel syndrome, right upper limb: Secondary | ICD-10-CM | POA: Diagnosis not present

## 2023-01-02 MED ORDER — TRIAMCINOLONE ACETONIDE 40 MG/ML IJ SUSP
40.0000 mg | Freq: Once | INTRAMUSCULAR | Status: AC
  Administered 2023-01-02: 40 mg via INTRA_ARTICULAR

## 2023-01-02 NOTE — Addendum Note (Signed)
Addended by: Venia Carbon K on: 01/02/2023 11:44 AM   Modules accepted: Orders

## 2023-01-02 NOTE — Progress Notes (Signed)
Subjective:    Patient ID: Heidi Mckee, female    DOB: [birth year], 67 y.o.   MRN: [id number]  Patient has a history of carpal tunnel syndrome in the right hand.  It is waking her up at night with burning pain in the thumb, index finger, and third digit.  She denies any weakness.  She has good grip strength.  There is no atrophy of the thenar eminence.  She has a positive Tinel's sign.  She has a positive Phalen sign.  The last cortisone injection lasted more than 2 months.  She is not yet ready for surgery per her report  Past Medical History:  Diagnosis Date   Bleeds easily (HCC)    per patient 11/20/2020 Pt states that when she was younger she had frequent nosebleeds. She states that it takes longer for her blood to clot. She did require a transfusion after a CSection but doesn't know why. She states that it has gotten better as she has gotten older. She states that she has never been diagnosed with any disorder.   Blood transfusion without reported diagnosis 1986   Carpal tunnel syndrome    Carpal tunnel syndrome of right wrist 05/12/2017   Heart murmur    MVP 34 yrs ago   HOH (hard of hearing) 11/20/2020   Patient has bilateral hearing aids.   Hx of migraines    11/20/20 pt states that she had migraines in her teens and 20's   UTI (urinary tract infection)    Past Surgical History:  Procedure Laterality Date   CESAREAN SECTION  1986   COLONOSCOPY     10 yrs ago   COLONOSCOPY  10/19/2019   DILATATION & CURETTAGE/HYSTEROSCOPY WITH MYOSURE N/A 11/23/2020   Procedure: DILATATION & CURETTAGE/HYSTEROSCOPY WITH MYOSURE;  Surgeon: Harold Hedge, MD;  Location: [facility];  Service: Gynecology;  Laterality: N/A;   Current Outpatient Medications on File Prior to Visit  Medication Sig Dispense Refill   Cholecalciferol 25 MCG (1000 UT) tablet Vitamin D3 25 mcg (1,000 unit) tablet   1 tablet every day by oral route.     gabapentin (NEURONTIN) 100 MG capsule Take 1  capsule (100 mg total) by mouth 3 (three) times daily. 90 capsule 6   Magnesium 200 MG TABS 1 tablet daily     Multiple Vitamin (MULTIVITAMIN WITH MINERALS) TABS tablet Take 1 tablet by mouth daily.     omega-3 fish oil (MAXEPA) 1000 MG CAPS capsule 2 capsules.     pantoprazole (PROTONIX) 40 MG tablet TAKE 1 TABLET BY MOUTH DAILY 90 tablet 3   azithromycin (ZITHROMAX) 250 MG tablet 2 tabs poqday1, 1 tab poqday 2-5 (Patient not taking: Reported on 11/24/2022) 6 tablet 0   chlorpheniramine-HYDROcodone (TUSSIONEX) 10-8 MG/5ML Take 5 mLs by mouth every 12 (twelve) hours as needed. 120 mL 0   predniSONE (DELTASONE) 20 MG tablet 3 tabs poqday 1-2, 2 tabs poqday 3-4, 1 tab poqday 5-6 (Patient not taking: Reported on 11/24/2022) 12 tablet 0   No current facility-administered medications on file prior to visit.    Allergies  Allergen Reactions   Sulfa Antibiotics Anaphylaxis   Penicillins     Unknown reaction - was told d/t reaction sulfa drugs   Social History   Socioeconomic History   Marital status: Married    Spouse name: Not on file   Number of children: Not on file   Years of education: Not on file   Highest education level: Bachelor's degree (e.g.,  BA, AB, BS)  Occupational History   Not on file  Tobacco Use   Smoking status: Former    Current packs/day: 0.00    Average packs/day: 0.5 packs/day for 40.0 years (20.0 ttl pk-yrs)    Types: Cigarettes    Start date: 07/07/1975    Quit date: 07/07/2015    Years since quitting: 7.4   Smokeless tobacco: Never  Vaping Use   Vaping status: Every Day   Substances: Nicotine, Flavoring  Substance and Sexual Activity   Alcohol use: Yes    Comment: Occasional   Drug use: Never   Sexual activity: Not on file  Other Topics Concern   Not on file  Social History Narrative   Not on file   Social Drivers of Health   Financial Resource Strain: Low Risk  (10/06/2022)   Overall Financial Resource Strain (CARDIA)    Difficulty of Paying Living  Expenses: Not hard at all  Food Insecurity: No Food Insecurity (10/06/2022)   Hunger Vital Sign    Worried About Running Out of Food in the Last Year: Never true    Ran Out of Food in the Last Year: Never true  Transportation Needs: No Transportation Needs (10/06/2022)   PRAPARE - Administrator, Civil Service (Medical): No    Lack of Transportation (Non-Medical): No  Physical Activity: Sufficiently Active (10/06/2022)   Exercise Vital Sign    Days of Exercise per Week: 4 days    Minutes of Exercise per Session: 60 min  Stress: No Stress Concern Present (10/06/2022)   [employer] of Occupational Health - Occupational Stress Questionnaire    Feeling of Stress : Only a little  Social Connections: Socially Integrated (10/06/2022)   Social Connection and Isolation Panel [NHANES]    Frequency of Communication with Friends and Family: More than three times a week    Frequency of Social Gatherings with Friends and Family: More than three times a week    Attends Religious Services: More than 4 times per year    Active Member of [employer] or Organizations: Yes    Attends Engineer, structural: More than 4 times per year    Marital Status: Married  Catering manager Violence: Not on file   Family History  Problem Relation Age of Onset   Dementia Mother    Stroke Father    Colon cancer Neg Hx    Colon polyps Neg Hx    Esophageal cancer Neg Hx    Rectal cancer Neg Hx    Stomach cancer Neg Hx      Review of Systems  All other systems reviewed and are negative.      Objective:   Physical Exam Vitals reviewed.  Constitutional:      Appearance: Normal appearance. She is normal weight.  Cardiovascular:     Rate and Rhythm: Normal rate and regular rhythm.     Heart sounds: Normal heart sounds. No murmur heard.    No friction rub. No gallop.  Pulmonary:     Effort: Pulmonary effort is normal.     Breath sounds: Normal breath sounds.  Neurological:     Mental  Status: She is alert.           Assessment & Plan:  Carpal tunnel syndrome of right wrist Patient does not want surgery.  She request that I perform a cortisone injection.  Using sterile technique, I injected a mixture of 1 cc of lidocaine and 1 cc of 40 mg/mL Kenalog  adjacent to the median nerve.  I inserted the needle at the distal flexor crease adjacent to the palmaris longus tendon.  Patient tolerated the procedure well without complication.  Recommended the patient see a hand surgeon if she develops any weakness

## 2023-01-05 ENCOUNTER — Ambulatory Visit: Payer: Medicare HMO | Admitting: Family Medicine

## 2023-02-17 DIAGNOSIS — H43821 Vitreomacular adhesion, right eye: Secondary | ICD-10-CM | POA: Diagnosis not present

## 2023-02-17 DIAGNOSIS — H04123 Dry eye syndrome of bilateral lacrimal glands: Secondary | ICD-10-CM | POA: Diagnosis not present

## 2023-02-17 DIAGNOSIS — H53143 Visual discomfort, bilateral: Secondary | ICD-10-CM | POA: Diagnosis not present

## 2023-03-26 DIAGNOSIS — L720 Epidermal cyst: Secondary | ICD-10-CM | POA: Diagnosis not present

## 2023-03-26 DIAGNOSIS — L905 Scar conditions and fibrosis of skin: Secondary | ICD-10-CM | POA: Diagnosis not present

## 2023-06-24 ENCOUNTER — Ambulatory Visit

## 2023-06-25 ENCOUNTER — Encounter: Payer: Self-pay | Admitting: Family Medicine

## 2023-08-10 ENCOUNTER — Encounter: Payer: Self-pay | Admitting: Family Medicine

## 2023-08-10 ENCOUNTER — Ambulatory Visit: Payer: Self-pay

## 2023-08-10 ENCOUNTER — Ambulatory Visit (INDEPENDENT_AMBULATORY_CARE_PROVIDER_SITE_OTHER): Admitting: Family Medicine

## 2023-08-10 VITALS — BP 136/84 | HR 67 | Temp 98.4°F | Ht 65.0 in | Wt 206.2 lb

## 2023-08-10 DIAGNOSIS — M62830 Muscle spasm of back: Secondary | ICD-10-CM | POA: Diagnosis not present

## 2023-08-10 MED ORDER — METHOCARBAMOL 500 MG PO TABS
500.0000 mg | ORAL_TABLET | Freq: Three times a day (TID) | ORAL | 1 refills | Status: AC | PRN
Start: 2023-08-10 — End: ?

## 2023-08-10 NOTE — Telephone Encounter (Signed)
  Appointment made for today 08/10/2023 at 10:20 AM with Dr Connie Emperor    FYI Only or Action Required?: FYI only for provider.  Patient was last seen in primary care on 01/02/2023 by Duanne Butler DASEN, MD.  Called Nurse Triage reporting Back Pain/Spasms.  Symptoms began about 4 days ago after mowing grass.  Interventions attempted: OTC medications: Aleve, Ibuprofen and Ice/heat application.  Symptoms are: gradually worsening.  Triage Disposition: See HCP Within 4 Hours (Or PCP Triage)  Patient/caregiver understands and will follow disposition?: Yes    Ice, heat, ibuprofen, aleve About 4 days Denies injuries, fever,             Red Word that prompted transfer to Nurse Triage: muscle spasm, when she move a certain why it painful and takes her breath away. Reason for Disposition  [1] SEVERE back pain (e.g., excruciating, unable to do any normal activities) AND [2] not improved 2 hours after pain medicine  Answer Assessment - Initial Assessment Questions 1. ONSET: When did the pain begin? (e.g., minutes, hours, days)     About 4 days ago after mowing grass 2. LOCATION: Where does it hurt? (upper, mid or lower back)     Right above bra line in back 3. SEVERITY: How bad is the pain?  (e.g., Scale 1-10; mild, moderate, or severe)     Off and on spasms 4. PATTERN: Is the pain constant? (e.g., yes, no; constant, intermittent)      Off and on spasms 5. RADIATION: Does the pain shoot into your legs or somewhere else?     No 6. CAUSE:  What do you think is causing the back pain?      Possibly from mowing grass  7. BACK OVERUSE:  Any recent lifting of heavy objects, strenuous work or exercise?     Mowing grass for about four hours 4 days ago 8. MEDICINES: What have you taken so far for the pain? (e.g., nothing, acetaminophen, NSAIDS)     Ibuprofen, Aleve, Ice, and Heat 9. NEUROLOGIC SYMPTOMS: Do you have any weakness, numbness, or problems with  bowel/bladder control?     No 10. OTHER SYMPTOMS: Do you have any other symptoms? (e.g., fever, abdomen pain, burning with urination, blood in urine)       No 11. PREGNANCY: Is there any chance you are pregnant? When was your last menstrual period?       No  Protocols used: Back Pain-A-AH

## 2023-08-10 NOTE — Progress Notes (Addendum)
 Patient Office Visit  Assessment & Plan:  Back spasm -     Methocarbamol ; Take 1 tablet (500 mg total) by mouth every 8 (eight) hours as needed for muscle spasms.  Dispense: 30 tablet; Refill: 1  Other orders -     HYDROcodone -Acetaminophen ; Take 1 tablet by mouth every 6 (six) hours as needed for moderate pain (pain score 4-6).  Dispense: 30 tablet; Refill: 0   Assessment and Plan    Acute mid back pain right sided Acute mid back pain likely due to muscle strain. Pain intermittent, exacerbated by movement. Gabapentin  ineffective, Tylenol  PM and ibuprofen provide some relief. Robaxin  preferred for daytime use. - Prescribed Robaxin  three times daily. - Advised Flexeril at night if needed. - Continue ibuprofen as needed. - Advised against hydrocodone  due to nausea (patient did not like the side effects from hydrocodone ) - Sent prescription to [facility].          No follow-ups on file.   Subjective:    Patient ID: Heidi Mckee, female    DOB: 01-28-1955  Age: 68 y.o. MRN: [id number]  Chief Complaint  Patient presents with   Back Pain    Pt c/o of back spasm/pain x 4 days. Pt states that the pain takes her breath.     Back Pain   Discussed the use of AI scribe software for clinical note transcription with the patient, who gave verbal consent to proceed.  History of Present Illness        Heidi Mckee is a 68 year old female who presents with mid back pain after mowing her lawn last week.  She experiences mid back pain that began after mowing her lawn last Thursday on a riding mower. The pain is described as a stabbing sensation that occurs with certain movements, particularly when reaching out with her right hand, brushing her teeth, or combing her hair. It is not constant but is triggered by specific actions, and she rates it as a 4 out of 10 this morning.  Patient states she is very right handed and cannot do much with left hand. Minimal activities such  as brushing teeth could exacerbate her pain.   She has tried various treatments including Tylenol  PM, which helped her sleep last night, as well as heat, ice, and ibuprofen. She takes two to three Advil (400 mg each) as needed. Gabapentin  is available but causes sleepiness without alleviating the pain.  No history of kidney stones, problems urinating, or blood in her urine. She also reports no recent falls or rib fractures. The pain does not radiate and is localized to the mid back area, just below the bra line, feels like a poker piercing through her. It does not worsen with deep breaths.  She uses a supportive wrap for lifting, which provides some relief but causes irritation. She has a history of rib injury from a past incident.  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 Physical Exam MUSCULOSKELETAL: Mid back pain on palpation. Normal range of motion on forward bending. Pain on lateral bending. Results Assessment & Plan Acute mid back pain right sided Acute mid back pain likely due to muscle strain. Pain intermittent, exacerbated by movement. Gabapentin  ineffective, Tylenol  PM and ibuprofen provide some relief. Robaxin  preferred for daytime use. - Prescribed Robaxin  three times daily. -  Advised Flexeril at night if needed. - Continue ibuprofen as needed. - Advised against hydrocodone  due to nausea (patient did not like the side effects from hydrocodone ) - Sent prescription to [facility].    The 10-year ASCVD risk score (Arnett DK, et al., 2019) is: 6.7%  Past Medical History:  Diagnosis Date   Allergy @ 1990   swelling, hives   Arthritis @2018    very little   Bleeds easily (HCC)    per patient 11/20/2020 Pt states that when she was younger she had frequent nosebleeds. She states that it takes longer  for her blood to clot. She did require a transfusion after a CSection but doesn't know why. She states that it has gotten better as she has gotten older. She states that she has never been diagnosed with any disorder.   Blood transfusion without reported diagnosis 1986   Carpal tunnel syndrome    Carpal tunnel syndrome of right wrist 05/12/2017   Heart murmur    MVP 34 yrs ago   HOH (hard of hearing) 11/20/2020   Patient has bilateral hearing aids.   Hx of migraines    11/20/20 pt states that she had migraines in her teens and 20's   UTI (urinary tract infection)    Past Surgical History:  Procedure Laterality Date   CESAREAN SECTION  05-11-1984   emergency c section   COLONOSCOPY     10 yrs ago   COLONOSCOPY  10/19/2019   DILATATION & CURETTAGE/HYSTEROSCOPY WITH MYOSURE N/A 11/23/2020   Procedure: DILATATION & CURETTAGE/HYSTEROSCOPY WITH MYOSURE;  Surgeon: Curlene Agent, MD;  Location: [facility];  Service: Gynecology;  Laterality: N/A;   EYE SURGERY  mar apr 2024   cataract   Social History   Tobacco Use   Smoking status: Former    Current packs/day: 0.00    Average packs/day: 0.5 packs/day for 40.0 years (20.0 ttl pk-yrs)    Types: Cigarettes    Start date: 07/07/1975    Quit date: 07/07/2015    Years since quitting: 8.1   Smokeless tobacco: Never  Vaping Use   Vaping status: Every Day   Substances: Nicotine, Flavoring  Substance Use Topics   Alcohol use: Yes    Comment: Occasional   Drug use: Never   Family History  Problem Relation Age of Onset   Dementia Mother    Hypertension Mother    Miscarriages / [place] Mother    Stroke Mother    Vision loss Mother    Stroke Father    Early death Father    Heart disease Father    Obesity Father    Cancer Maternal Grandfather    Stroke Maternal Grandmother    Varicose Veins Maternal Grandmother    Stroke Paternal Grandfather    Arthritis Paternal Grandmother    Diabetes Paternal Grandmother     Obesity Paternal Grandmother    Diabetes Maternal Uncle    ADD / ADHD Daughter    Arthritis Maternal Aunt    Diabetes Maternal Aunt    Vision loss Maternal Aunt    Diabetes Paternal Aunt    Obesity Paternal Aunt    Colon cancer Neg Hx    Colon polyps Neg Hx    Esophageal cancer Neg Hx    Rectal cancer Neg Hx    Stomach cancer Neg Hx    Allergies  Allergen Reactions   Sulfa Antibiotics Anaphylaxis   Penicillins     Unknown reaction - was told d/t reaction sulfa  drugs    Review of Systems  Musculoskeletal:  Positive for back pain.      Objective:    BP 136/84   Pulse 67   Temp 98.4 F (36.9 C)   Ht 5' 5 (1.651 m)   Wt 206 lb 4 oz (93.6 kg)   SpO2 99%   BMI 34.32 kg/m  BP Readings from Last 3 Encounters:  08/10/23 136/84  01/02/23 122/84  11/24/22 124/82   Wt Readings from Last 3 Encounters:  08/10/23 206 lb 4 oz (93.6 kg)  01/02/23 199 lb 6 oz (90.4 kg)  11/24/22 201 lb 8 oz (91.4 kg)    Physical Exam Vitals and nursing note reviewed.  Constitutional:      Appearance: Normal appearance.  HENT:     Head: Normocephalic.     Comments: Patient does wear hearing aids.     Right Ear: Tympanic membrane, ear canal and external ear normal.     Left Ear: Tympanic membrane, ear canal and external ear normal.  Eyes:     Extraocular Movements: Extraocular movements intact.     Pupils: Pupils are equal, round, and reactive to light.  Cardiovascular:     Rate and Rhythm: Normal rate and regular rhythm.     Heart sounds: Normal heart sounds.  Pulmonary:     Effort: Pulmonary effort is normal.     Breath sounds: Normal breath sounds.  Musculoskeletal:     Thoracic back: Normal range of motion.     Right lower leg: No edema.     Left lower leg: No edema.     Comments: Has tenderness over right mid back area under bra strap, no rash seen. Patient has difficulty getting on exam table due to discomfort. Patient can do lateral rotation but has discomfort when leaning to  the left side.   Neurological:     General: No focal deficit present.     Mental Status: She is alert and oriented to person, place, and time.  Psychiatric:        Mood and Affect: Mood normal.        Behavior: Behavior normal.      No results found for any visits on 08/10/23.

## 2023-08-13 ENCOUNTER — Encounter: Payer: Self-pay | Admitting: Family Medicine

## 2023-08-21 MED ORDER — HYDROCODONE-ACETAMINOPHEN 5-325 MG PO TABS: 1.0000 | ORAL_TABLET | Freq: Four times a day (QID) | ORAL | 0 refills | Status: DC | PRN

## 2023-08-21 NOTE — Addendum Note (Signed)
 Addended by: ALETHA CONNIE HERO on: 08/21/2023 06:10 PM   Modules accepted: Orders

## 2023-08-24 ENCOUNTER — Other Ambulatory Visit: Payer: Self-pay | Admitting: Family Medicine

## 2023-08-24 ENCOUNTER — Other Ambulatory Visit: Payer: Self-pay

## 2023-08-24 DIAGNOSIS — M62830 Muscle spasm of back: Secondary | ICD-10-CM

## 2023-08-24 MED ORDER — TRAMADOL HCL 50 MG PO TABS: 50.0000 mg | ORAL_TABLET | Freq: Three times a day (TID) | ORAL | 0 refills | Status: AC | PRN

## 2023-08-24 MED ORDER — TRAMADOL HCL 50 MG PO TABS: 50.0000 mg | ORAL_TABLET | Freq: Three times a day (TID) | ORAL | 0 refills | Status: DC | PRN

## 2023-08-24 NOTE — Progress Notes (Signed)
 Needs for severe muscle spasm/pain

## 2023-09-09 DIAGNOSIS — D2271 Melanocytic nevi of right lower limb, including hip: Secondary | ICD-10-CM | POA: Diagnosis not present

## 2023-09-09 DIAGNOSIS — D225 Melanocytic nevi of trunk: Secondary | ICD-10-CM | POA: Diagnosis not present

## 2023-09-09 DIAGNOSIS — L821 Other seborrheic keratosis: Secondary | ICD-10-CM | POA: Diagnosis not present

## 2023-09-09 DIAGNOSIS — L738 Other specified follicular disorders: Secondary | ICD-10-CM | POA: Diagnosis not present

## 2023-09-09 DIAGNOSIS — L820 Inflamed seborrheic keratosis: Secondary | ICD-10-CM | POA: Diagnosis not present

## 2023-09-09 DIAGNOSIS — L720 Epidermal cyst: Secondary | ICD-10-CM | POA: Diagnosis not present

## 2023-09-09 DIAGNOSIS — D2272 Melanocytic nevi of left lower limb, including hip: Secondary | ICD-10-CM | POA: Diagnosis not present

## 2023-09-09 DIAGNOSIS — L570 Actinic keratosis: Secondary | ICD-10-CM | POA: Diagnosis not present

## 2023-10-23 ENCOUNTER — Encounter: Payer: Self-pay | Admitting: Family Medicine

## 2023-10-23 ENCOUNTER — Telehealth: Payer: Self-pay

## 2023-10-23 NOTE — Telephone Encounter (Signed)
 Copied from CRM #[id number]. Topic: Clinical - Medical Advice >> Oct 23, 2023 11:38 AM Dedra B wrote: Reason for CRM: Pt wants to know if she can get another shot for her carpal tunnel. Pls call pt at [phone].

## 2023-10-26 ENCOUNTER — Ambulatory Visit: Admitting: Family Medicine

## 2023-10-26 VITALS — BP 124/88 | HR 110 | Temp 98.3°F | Ht 65.0 in | Wt 207.4 lb

## 2023-10-26 DIAGNOSIS — G5601 Carpal tunnel syndrome, right upper limb: Secondary | ICD-10-CM

## 2023-10-26 MED ORDER — TRIAMCINOLONE ACETONIDE 40 MG/ML IJ SUSP
40.0000 mg | Freq: Once | INTRAMUSCULAR | Status: AC
  Administered 2023-10-26: 40 mg via INTRA_ARTICULAR

## 2023-10-26 NOTE — Addendum Note (Signed)
 Addended by: ERIKA ELIDA RAMAN on: 10/26/2023 04:14 PM   Modules accepted: Orders

## 2023-10-26 NOTE — Progress Notes (Signed)
 Subjective:    Patient ID: Heidi Mckee, female    DOB: [birth year], 68 y.o.   MRN: [id number]  Patient has a history of carpal tunnel syndrome in the right hand.  It is waking her up at night with burning pain in the thumb, index finger, and third digit.  She denies any weakness.  She has good grip strength.  There is no atrophy of the thenar eminence.  She has a positive Tinel's sign.  She has a positive Phalen sign.  The last cortisone injection lasted more than 10 months as she got it in December 2024.  She is not yet ready for surgery per her report  Past Medical History:  Diagnosis Date   Allergy @ 1990   swelling, hives   Arthritis @2018    very little   Bleeds easily    per patient 11/20/2020 Pt states that when she was younger she had frequent nosebleeds. She states that it takes longer for her blood to clot. She did require a transfusion after a CSection but doesn't know why. She states that it has gotten better as she has gotten older. She states that she has never been diagnosed with any disorder.   Blood transfusion without reported diagnosis 1986   Carpal tunnel syndrome    Carpal tunnel syndrome of right wrist 05/12/2017   Heart murmur    MVP 34 yrs ago   HOH (hard of hearing) 11/20/2020   Patient has bilateral hearing aids.   Hx of migraines    11/20/20 pt states that she had migraines in her teens and 20's   UTI (urinary tract infection)    Past Surgical History:  Procedure Laterality Date   CESAREAN SECTION  05-11-1984   emergency c section   COLONOSCOPY     10 yrs ago   COLONOSCOPY  10/19/2019   DILATATION & CURETTAGE/HYSTEROSCOPY WITH MYOSURE N/A 11/23/2020   Procedure: DILATATION & CURETTAGE/HYSTEROSCOPY WITH MYOSURE;  Surgeon: Curlene Agent, MD;  Location: [facility];  Service: Gynecology;  Laterality: N/A;   EYE SURGERY  mar apr 2024   cataract   Current Outpatient Medications on File Prior to Visit  Medication Sig Dispense Refill    Cholecalciferol 25 MCG (1000 UT) tablet Vitamin D3 25 mcg (1,000 unit) tablet   1 tablet every day by oral route.     gabapentin  (NEURONTIN ) 100 MG capsule Take 1 capsule (100 mg total) by mouth 3 (three) times daily. 90 capsule 6   Magnesium 200 MG TABS 1 tablet daily     methocarbamol  (ROBAXIN ) 500 MG tablet Take 1 tablet (500 mg total) by mouth every 8 (eight) hours as needed for muscle spasms. 30 tablet 1   Multiple Vitamin (MULTIVITAMIN WITH MINERALS) TABS tablet Take 1 tablet by mouth daily.     omega-3 fish oil (MAXEPA) 1000 MG CAPS capsule 2 capsules.     pantoprazole  (PROTONIX ) 40 MG tablet TAKE 1 TABLET BY MOUTH DAILY 90 tablet 3   traMADol  (ULTRAM ) 50 MG tablet Take 1 tablet (50 mg total) by mouth every 8 (eight) hours as needed. 30 tablet 0   No current facility-administered medications on file prior to visit.    Allergies  Allergen Reactions   Sulfa Antibiotics Anaphylaxis   Penicillins     Unknown reaction - was told d/t reaction sulfa drugs   Social History   Socioeconomic History   Marital status: Married    Spouse name: Not on file   Number of children:  Not on file   Years of education: Not on file   Highest education level: Bachelor's degree (e.g., BA, AB, BS)  Occupational History   Not on file  Tobacco Use   Smoking status: Former    Current packs/day: 0.00    Average packs/day: 0.5 packs/day for 40.0 years (20.0 ttl pk-yrs)    Types: Cigarettes    Start date: 07/07/1975    Quit date: 07/07/2015    Years since quitting: 8.3   Smokeless tobacco: Never  Vaping Use   Vaping status: Every Day   Substances: Nicotine, Flavoring  Substance and Sexual Activity   Alcohol use: Yes    Comment: Occasional   Drug use: Never   Sexual activity: Not on file  Other Topics Concern   Not on file  Social History Narrative   Not on file   Social Drivers of Health   Financial Resource Strain: Low Risk  (10/26/2023)   Overall Financial Resource Strain (CARDIA)     Difficulty of Paying Living Expenses: Not hard at all  Food Insecurity: No Food Insecurity (10/26/2023)   Hunger Vital Sign    Worried About Running Out of Food in the Last Year: Never true    Ran Out of Food in the Last Year: Never true  Transportation Needs: No Transportation Needs (10/26/2023)   PRAPARE - Administrator, Civil Service (Medical): No    Lack of Transportation (Non-Medical): No  Physical Activity: Unknown (10/26/2023)   Exercise Vital Sign    Days of Exercise per Week: Patient declined    Minutes of Exercise per Session: Not on file  Stress: No Stress Concern Present (10/26/2023)   [employer] of Occupational Health - Occupational Stress Questionnaire    Feeling of Stress: Not at all  Social Connections: Socially Integrated (10/26/2023)   Social Connection and Isolation Panel    Frequency of Communication with Friends and Family: More than three times a week    Frequency of Social Gatherings with Friends and Family: Once a week    Attends Religious Services: More than 4 times per year    Active Member of [employer] or Organizations: Yes    Attends Engineer, structural: More than 4 times per year    Marital Status: Married  Catering manager Violence: Not on file   Family History  Problem Relation Age of Onset   Dementia Mother    Hypertension Mother    Miscarriages / [place] Mother    Stroke Mother    Vision loss Mother    Stroke Father    Early death Father    Heart disease Father    Obesity Father    Cancer Maternal Grandfather    Stroke Maternal Grandmother    Varicose Veins Maternal Grandmother    Stroke Paternal Grandfather    Arthritis Paternal Grandmother    Diabetes Paternal Grandmother    Obesity Paternal Grandmother    Diabetes Maternal Uncle    ADD / ADHD Daughter    Arthritis Maternal Aunt    Diabetes Maternal Aunt    Vision loss Maternal Aunt    Diabetes Paternal Aunt    Obesity Paternal Aunt    Colon cancer Neg  Hx    Colon polyps Neg Hx    Esophageal cancer Neg Hx    Rectal cancer Neg Hx    Stomach cancer Neg Hx      Review of Systems  All other systems reviewed and are negative.  Objective:   Physical Exam Vitals reviewed.  Constitutional:      Appearance: Normal appearance. She is normal weight.  Cardiovascular:     Rate and Rhythm: Normal rate and regular rhythm.     Heart sounds: Normal heart sounds. No murmur heard.    No friction rub. No gallop.  Pulmonary:     Effort: Pulmonary effort is normal.     Breath sounds: Normal breath sounds.  Neurological:     Mental Status: She is alert.           Assessment & Plan:  Carpal tunnel syndrome of right wrist  Patient does not want surgery.  She request that I perform a cortisone injection.  Using sterile technique, I injected a mixture of 1 cc of lidocaine  and 1 cc of 40 mg/mL Kenalog  adjacent to the median nerve.  I inserted the needle at the distal flexor crease adjacent to the palmaris longus tendon on the ulnar side at 45 degree angle.  Patient tolerated the procedure well without complication.  Recommended the patient see a hand surgeon if she develops any weakness

## 2023-10-27 ENCOUNTER — Encounter: Payer: Self-pay | Admitting: Family Medicine

## 2023-10-27 ENCOUNTER — Ambulatory Visit

## 2023-10-27 DIAGNOSIS — Z23 Encounter for immunization: Secondary | ICD-10-CM | POA: Diagnosis not present

## 2023-10-27 NOTE — Progress Notes (Signed)
 Patient is in office today for a nurse visit for Immunization. Patient Injection was given in the  Right deltoid. Patient tolerated injection well.

## 2023-11-18 ENCOUNTER — Other Ambulatory Visit: Payer: Self-pay | Admitting: Orthopedic Surgery

## 2023-11-18 DIAGNOSIS — G5601 Carpal tunnel syndrome, right upper limb: Secondary | ICD-10-CM | POA: Diagnosis not present

## 2023-11-26 ENCOUNTER — Encounter: Payer: Self-pay | Admitting: Family Medicine

## 2023-11-26 ENCOUNTER — Ambulatory Visit: Admitting: Family Medicine

## 2023-11-26 VITALS — BP 148/82 | HR 64 | Temp 98.4°F | Ht 65.0 in | Wt 206.0 lb

## 2023-11-26 DIAGNOSIS — Z13220 Encounter for screening for lipoid disorders: Secondary | ICD-10-CM

## 2023-11-26 DIAGNOSIS — Z0000 Encounter for general adult medical examination without abnormal findings: Secondary | ICD-10-CM

## 2023-11-26 LAB — LIPID PANEL
Cholesterol: 240 mg/dL — ABNORMAL HIGH (ref <200)
HDL: 73 mg/dL (ref >=50)
LDL Cholesterol (Calc): 144 mg/dL — ABNORMAL HIGH
Non-HDL Cholesterol (Calc): 167 mg/dL — ABNORMAL HIGH (ref <130)
Total CHOL/HDL Ratio: 3.3 (calc) (ref <5.0)
Triglycerides: 109 mg/dL (ref <150)

## 2023-11-26 LAB — CBC WITH DIFFERENTIAL/PLATELET
Absolute Lymphocytes: 1676 {cells}/uL (ref 850–3900)
Absolute Monocytes: 554 {cells}/uL (ref 200–950)
Basophils Absolute: 73 {cells}/uL (ref 0–200)
Basophils Relative: 1.1 %
Eosinophils Absolute: 211 {cells}/uL (ref 15–500)
Eosinophils Relative: 3.2 %
HCT: 45.4 % — ABNORMAL HIGH (ref 35.0–45.0)
Hemoglobin: 14.7 g/dL (ref 11.7–15.5)
MCH: 28.8 pg (ref 27.0–33.0)
MCHC: 32.4 g/dL (ref 32.0–36.0)
MCV: 89 fL (ref 80.0–100.0)
MPV: 9.2 fL (ref 7.5–12.5)
Monocytes Relative: 8.4 %
Neutro Abs: 4085 {cells}/uL (ref 1500–7800)
Neutrophils Relative %: 61.9 %
Platelets: 321 Thousand/uL (ref 140–400)
RBC: 5.1 Million/uL (ref 3.80–5.10)
RDW: 13.5 % (ref 11.0–15.0)
Total Lymphocyte: 25.4 %
WBC: 6.6 Thousand/uL (ref 3.8–10.8)

## 2023-11-26 LAB — COMPREHENSIVE METABOLIC PANEL WITH GFR
AG Ratio: 1.6 (calc) (ref 1.0–2.5)
ALT: 21 U/L (ref 6–29)
AST: 16 U/L (ref 10–35)
Albumin: 4.2 g/dL (ref 3.6–5.1)
Alkaline phosphatase (APISO): 27 U/L — ABNORMAL LOW (ref 37–153)
BUN: 11 mg/dL (ref 7–25)
CO2: 31 mmol/L (ref 20–32)
Calcium: 9.3 mg/dL (ref 8.6–10.4)
Chloride: 100 mmol/L (ref 98–110)
Creat: 0.66 mg/dL (ref 0.50–1.05)
Globulin: 2.6 g/dL (ref 1.9–3.7)
Glucose, Bld: 77 mg/dL (ref 65–99)
Potassium: 4.1 mmol/L (ref 3.5–5.3)
Sodium: 139 mmol/L (ref 135–146)
Total Bilirubin: 0.5 mg/dL (ref 0.2–1.2)
Total Protein: 6.8 g/dL (ref 6.1–8.1)
eGFR: 97 mL/min/1.73m2 (ref >=60)

## 2023-11-26 MED ORDER — GABAPENTIN 100 MG PO CAPS: 100.0000 mg | ORAL_CAPSULE | Freq: Three times a day (TID) | ORAL | 6 refills | Status: AC

## 2023-11-26 NOTE — Progress Notes (Signed)
 Subjective:     Patient ID: Heidi Mckee, female   DOB: 10-09-1955, 68 y.o.   MRN: [id number]  HPI Patient is a very pleasant 68 year old Caucasian female who is here today for a physical exam.  Patient colonoscopy in 2021 and therefore this is up-to-date.  Immunization History  Administered Date(s) Administered   Fluad Quad(high Dose 65+) 10/22/2020   Fluad Trivalent(High Dose 65+) 11/24/2022   INFLUENZA, HIGH DOSE SEASONAL PF 10/27/2023   Influenza-Unspecified 09/30/2018, 10/22/2020, 11/12/2021   Moderna Sars-Covid-2 Vaccination 08/18/2019, 09/15/2019   Pneumococcal Polysaccharide-23 11/24/2022   Tdap 04/06/2017   Unspecified SARS-COV-2 Vaccination 09/15/2019   Zoster Recombinant(Shingrix ) 04/06/2017, 07/06/2017   Zoster, Live 07/06/2017   Patient's shots are up-to-date except for the RSV vaccine and the COVID shot.  She declines both of these today.  She is due for a mammogram.  She gets this when she sees her gynecologist and this has been scheduled for December.  She had a bone density test last year.  Her bone density test showed mild low bone mass.  She is currently on calcium and vitamin D.  She does not require another bone density test until 2029.  Her Pap smear performed by her gynecologist.  Overall she is doing well.  Her biggest concern has been her elevated weight.  Her blood pressure today is elevated at 148/82   Past Medical History:  Diagnosis Date   Allergy @ 1990   swelling, hives   Arthritis @2018    very little   Bleeds easily    per patient 11/20/2020 Pt states that when she was younger she had frequent nosebleeds. She states that it takes longer for her blood to clot. She did require a transfusion after a CSection but doesn't know why. She states that it has gotten better as she has gotten older. She states that she has never been diagnosed with any disorder.   Blood transfusion without reported diagnosis 1986   Carpal tunnel syndrome    Carpal tunnel syndrome of  right wrist 05/12/2017   Heart murmur    MVP 34 yrs ago   HOH (hard of hearing) 11/20/2020   Patient has bilateral hearing aids.   Hx of migraines    11/20/20 pt states that she had migraines in her teens and 20's   UTI (urinary tract infection)    Current Outpatient Medications on File Prior to Visit  Medication Sig Dispense Refill   Cholecalciferol 25 MCG (1000 UT) tablet Vitamin D3 25 mcg (1,000 unit) tablet   1 tablet every day by oral route.     Magnesium 200 MG TABS 1 tablet daily     methocarbamol  (ROBAXIN ) 500 MG tablet Take 1 tablet (500 mg total) by mouth every 8 (eight) hours as needed for muscle spasms. 30 tablet 1   Multiple Vitamin (MULTIVITAMIN WITH MINERALS) TABS tablet Take 1 tablet by mouth daily.     omega-3 fish oil (MAXEPA) 1000 MG CAPS capsule 2 capsules.     pantoprazole  (PROTONIX ) 40 MG tablet TAKE 1 TABLET BY MOUTH DAILY 90 tablet 3   traMADol  (ULTRAM ) 50 MG tablet Take 1 tablet (50 mg total) by mouth every 8 (eight) hours as needed. 30 tablet 0   No current facility-administered medications on file prior to visit.   Allergies  Allergen Reactions   Sulfa Antibiotics Anaphylaxis   Penicillins     Unknown reaction - was told d/t reaction sulfa drugs   Social History   Socioeconomic History   Marital  status: Married    Spouse name: Not on file   Number of children: Not on file   Years of education: Not on file   Highest education level: Bachelor's degree (e.g., BA, AB, BS)  Occupational History   Not on file  Tobacco Use   Smoking status: Former    Current packs/day: 0.00    Average packs/day: 0.5 packs/day for 40.0 years (20.0 ttl pk-yrs)    Types: Cigarettes    Start date: 07/07/1975    Quit date: 07/07/2015    Years since quitting: 8.3   Smokeless tobacco: Never  Vaping Use   Vaping status: Every Day   Substances: Nicotine, Flavoring  Substance and Sexual Activity   Alcohol use: Yes    Comment: Occasional   Drug use: Never   Sexual activity:  Not on file  Other Topics Concern   Not on file  Social History Narrative   Not on file   Social Drivers of Health   Financial Resource Strain: Low Risk  (10/26/2023)   Overall Financial Resource Strain (CARDIA)    Difficulty of Paying Living Expenses: Not hard at all  Food Insecurity: No Food Insecurity (10/26/2023)   Hunger Vital Sign    Worried About Running Out of Food in the Last Year: Never true    Ran Out of Food in the Last Year: Never true  Transportation Needs: No Transportation Needs (10/26/2023)   PRAPARE - Administrator, Civil Service (Medical): No    Lack of Transportation (Non-Medical): No  Physical Activity: Unknown (10/26/2023)   Exercise Vital Sign    Days of Exercise per Week: Patient declined    Minutes of Exercise per Session: Not on file  Stress: No Stress Concern Present (10/26/2023)   [employer] of Occupational Health - Occupational Stress Questionnaire    Feeling of Stress: Not at all  Social Connections: Socially Integrated (10/26/2023)   Social Connection and Isolation Panel    Frequency of Communication with Friends and Family: More than three times a week    Frequency of Social Gatherings with Friends and Family: Once a week    Attends Religious Services: More than 4 times per year    Active Member of [employer] or Organizations: Yes    Attends Engineer, Structural: More than 4 times per year    Marital Status: Married  Catering Manager Violence: Not on file   Family History  Problem Relation Age of Onset   Dementia Mother    Hypertension Mother    Miscarriages / Stillbirths Mother    Stroke Mother    Vision loss Mother    Stroke Father    Early death Father    Heart disease Father    Obesity Father    Cancer Maternal Grandfather    Stroke Maternal Grandmother    Varicose Veins Maternal Grandmother    Stroke Paternal Grandfather    Arthritis Paternal Grandmother    Diabetes Paternal Grandmother    Obesity  Paternal Grandmother    Diabetes Maternal Uncle    ADD / ADHD Daughter    Arthritis Maternal Aunt    Diabetes Maternal Aunt    Vision loss Maternal Aunt    Diabetes Paternal Aunt    Obesity Paternal Aunt    Colon cancer Neg Hx    Colon polyps Neg Hx    Esophageal cancer Neg Hx    Rectal cancer Neg Hx    Stomach cancer Neg Hx   Mother died from  vascular dementia.  She does have a family history of strokes.  Review of Systems  All other systems reviewed and are negative.      Objective:   Physical Exam Vitals reviewed.  Constitutional:      General: She is not in acute distress.    Appearance: She is well-developed. She is not diaphoretic.  HENT:     Head: Normocephalic and atraumatic.     Right Ear: External ear normal.     Left Ear: External ear normal.     Nose: Nose normal.     Mouth/Throat:     Pharynx: No oropharyngeal exudate.  Eyes:     General: No scleral icterus.       Right eye: No discharge.        Left eye: No discharge.     Conjunctiva/sclera: Conjunctivae normal.     Pupils: Pupils are equal, round, and reactive to light.  Neck:     Thyroid: No thyromegaly.     Vascular: No JVD.     Trachea: No tracheal deviation.  Cardiovascular:     Rate and Rhythm: Normal rate and regular rhythm.     Heart sounds: Normal heart sounds. No murmur heard.    No friction rub. No gallop.  Pulmonary:     Effort: Pulmonary effort is normal. No respiratory distress.     Breath sounds: Normal breath sounds. No stridor. No wheezing or rales.  Chest:     Chest wall: No tenderness.  Abdominal:     General: Bowel sounds are normal. There is no distension.     Palpations: Abdomen is soft. There is no mass.     Tenderness: There is no abdominal tenderness. There is no guarding or rebound.  Musculoskeletal:        General: No tenderness or deformity. Normal range of motion.     Cervical back: Normal range of motion.  Lymphadenopathy:     Cervical: No cervical adenopathy.   Skin:    General: Skin is warm.     Coloration: Skin is not pale.     Findings: No erythema or rash.  Neurological:     Mental Status: She is alert and oriented to person, place, and time.     Cranial Nerves: No cranial nerve deficit.     Motor: No abnormal muscle tone.     Coordination: Coordination normal.     Deep Tendon Reflexes: Reflexes are normal and symmetric. Reflexes normal.  Psychiatric:        Behavior: Behavior normal.        Thought Content: Thought content normal.        Judgment: Judgment normal.        Assessment:    Screening cholesterol level - Plan: CBC with Differential/Platelet, Comprehensive metabolic panel with GFR, Lipid panel  General medical exam  Plan:  Patient's blood pressure is high enough to treat today.  However this is the first time it has been elevated.  I believe weight loss would help lower her blood pressure.  If she could achieve 10 to 20 pounds of weight loss, I believe her blood pressure would improve dramatically.  We discussed options and she is interested in potentially trying [facility] as a means to lose weight.  She will check with her insurance and starting in January she may want to try the oral Wegovy.  Her colonoscopy is up-to-date.  She is due for mammogram but she is scheduled this with her gynecologist.  Her bone  density is up-to-date.  She declines RSV vaccine and the COVID shot.  I will check a CBC, CMP, and a fasting lipid panel.

## 2023-11-27 ENCOUNTER — Ambulatory Visit: Payer: Self-pay | Admitting: Family Medicine

## 2023-12-16 DIAGNOSIS — Z13220 Encounter for screening for lipoid disorders: Secondary | ICD-10-CM | POA: Insufficient documentation

## 2023-12-24 ENCOUNTER — Encounter: Payer: Self-pay | Admitting: Family Medicine

## 2024-01-04 ENCOUNTER — Encounter (HOSPITAL_BASED_OUTPATIENT_CLINIC_OR_DEPARTMENT_OTHER): Payer: Self-pay | Admitting: Orthopedic Surgery

## 2024-01-04 ENCOUNTER — Other Ambulatory Visit: Payer: Self-pay

## 2024-01-11 ENCOUNTER — Encounter (HOSPITAL_BASED_OUTPATIENT_CLINIC_OR_DEPARTMENT_OTHER): Admission: RE | Disposition: A | Payer: Self-pay | Source: Home / Self Care | Attending: Orthopedic Surgery

## 2024-01-11 ENCOUNTER — Ambulatory Visit (HOSPITAL_BASED_OUTPATIENT_CLINIC_OR_DEPARTMENT_OTHER): Admitting: Certified Registered Nurse Anesthetist

## 2024-01-11 ENCOUNTER — Other Ambulatory Visit: Payer: Self-pay

## 2024-01-11 ENCOUNTER — Encounter (HOSPITAL_BASED_OUTPATIENT_CLINIC_OR_DEPARTMENT_OTHER): Payer: Self-pay | Admitting: Orthopedic Surgery

## 2024-01-11 ENCOUNTER — Ambulatory Visit (HOSPITAL_BASED_OUTPATIENT_CLINIC_OR_DEPARTMENT_OTHER)
Admission: RE | Admit: 2024-01-11 | Discharge: 2024-01-11 | Disposition: A | Discharge status: Home / Self Care | Attending: Orthopedic Surgery | Admitting: Orthopedic Surgery

## 2024-01-11 DIAGNOSIS — Z79899 Other long term (current) drug therapy: Secondary | ICD-10-CM | POA: Diagnosis not present

## 2024-01-11 DIAGNOSIS — Z87891 Personal history of nicotine dependence: Secondary | ICD-10-CM | POA: Diagnosis not present

## 2024-01-11 DIAGNOSIS — G5601 Carpal tunnel syndrome, right upper limb: Secondary | ICD-10-CM | POA: Insufficient documentation

## 2024-01-11 DIAGNOSIS — K219 Gastro-esophageal reflux disease without esophagitis: Secondary | ICD-10-CM | POA: Diagnosis not present

## 2024-01-11 DIAGNOSIS — Z01818 Encounter for other preprocedural examination: Secondary | ICD-10-CM

## 2024-01-11 HISTORY — PX: CARPAL TUNNEL RELEASE: SHX101

## 2024-01-11 HISTORY — DX: Nonrheumatic mitral (valve) prolapse: I34.1

## 2024-01-11 SURGERY — CARPAL TUNNEL RELEASE
Anesthesia: General | Site: Wrist | Laterality: Right

## 2024-01-11 MED ORDER — MIDAZOLAM HCL 5 MG/5ML IJ SOLN
INTRAMUSCULAR | Status: DC | PRN
  Administered 2024-01-11 (×2): 1 mg via INTRAVENOUS

## 2024-01-11 MED ORDER — KETOROLAC TROMETHAMINE 30 MG/ML IJ SOLN
INTRAMUSCULAR | Status: DC | PRN
  Administered 2024-01-11: 30 mg via INTRAVENOUS

## 2024-01-11 MED ORDER — ONDANSETRON HCL 4 MG/2ML IJ SOLN
INTRAMUSCULAR | Status: AC
  Filled 2024-01-11: qty 2

## 2024-01-11 MED ORDER — PROPOFOL 10 MG/ML IV BOLUS
INTRAVENOUS | Status: AC
  Filled 2024-01-11: qty 20

## 2024-01-11 MED ORDER — LIDOCAINE HCL (CARDIAC) PF 100 MG/5ML IV SOSY
PREFILLED_SYRINGE | INTRAVENOUS | Status: DC | PRN
  Administered 2024-01-11: 100 mg via INTRAVENOUS

## 2024-01-11 MED ORDER — FENTANYL CITRATE (PF) 100 MCG/2ML IJ SOLN
INTRAMUSCULAR | Status: AC
  Filled 2024-01-11: qty 2

## 2024-01-11 MED ORDER — ACETAMINOPHEN 10 MG/ML IV SOLN
INTRAVENOUS | Status: AC
  Filled 2024-01-11: qty 100

## 2024-01-11 MED ORDER — CEFAZOLIN SODIUM-DEXTROSE 2-3 GM-%(50ML) IV SOLR
INTRAVENOUS | Status: DC | PRN
  Administered 2024-01-11: 2 g via INTRAVENOUS

## 2024-01-11 MED ORDER — OXYCODONE HCL 5 MG PO TABS: 5.0000 mg | ORAL_TABLET | Freq: Once | ORAL | Status: DC | PRN

## 2024-01-11 MED ORDER — OXYCODONE HCL 5 MG/5ML PO SOLN: 5.0000 mg | Freq: Once | ORAL | Status: DC | PRN

## 2024-01-11 MED ORDER — MIDAZOLAM HCL 2 MG/2ML IJ SOLN
INTRAMUSCULAR | Status: AC
  Filled 2024-01-11: qty 2

## 2024-01-11 MED ORDER — FENTANYL CITRATE (PF) 100 MCG/2ML IJ SOLN: 25.0000 ug | INTRAMUSCULAR | Status: DC | PRN

## 2024-01-11 MED ORDER — ONDANSETRON HCL 4 MG/2ML IJ SOLN: 4.0000 mg | Freq: Once | INTRAMUSCULAR | Status: DC | PRN

## 2024-01-11 MED ORDER — PROPOFOL 10 MG/ML IV BOLUS
INTRAVENOUS | Status: DC | PRN
  Administered 2024-01-11: 100 mg via INTRAVENOUS
  Administered 2024-01-11: 30 mg via INTRAVENOUS

## 2024-01-11 MED ORDER — BUPIVACAINE HCL (PF) 0.25 % IJ SOLN
INTRAMUSCULAR | Status: DC | PRN
  Administered 2024-01-11: 9 mL

## 2024-01-11 MED ORDER — ONDANSETRON HCL 4 MG/2ML IJ SOLN
INTRAMUSCULAR | Status: DC | PRN
  Administered 2024-01-11: 4 mg via INTRAVENOUS

## 2024-01-11 MED ORDER — 0.9 % SODIUM CHLORIDE (POUR BTL) OPTIME
TOPICAL | Status: DC | PRN
  Administered 2024-01-11: 120 mL

## 2024-01-11 MED ORDER — DEXAMETHASONE SODIUM PHOSPHATE 4 MG/ML IJ SOLN
INTRAMUSCULAR | Status: DC | PRN
  Administered 2024-01-11: 5 mg via INTRAVENOUS

## 2024-01-11 MED ORDER — FENTANYL CITRATE (PF) 100 MCG/2ML IJ SOLN
INTRAMUSCULAR | Status: DC | PRN
  Administered 2024-01-11 (×2): 50 ug via INTRAVENOUS

## 2024-01-11 MED ORDER — ACETAMINOPHEN 10 MG/ML IV SOLN
1000.0000 mg | Freq: Once | INTRAVENOUS | Status: DC | PRN
  Administered 2024-01-11: 1000 mg via INTRAVENOUS

## 2024-01-11 MED ORDER — OXYCODONE-ACETAMINOPHEN 5-325 MG PO TABS: 1.0000 | ORAL_TABLET | Freq: Four times a day (QID) | ORAL | 0 refills | Status: AC | PRN

## 2024-01-11 MED ORDER — LACTATED RINGERS IV SOLN: INTRAVENOUS | Status: DC

## 2024-01-11 MED ORDER — AMISULPRIDE (ANTIEMETIC) 5 MG/2ML IV SOLN: 10.0000 mg | Freq: Once | INTRAVENOUS | Status: DC | PRN

## 2024-01-11 SURGICAL SUPPLY — 28 items
BLADE SURG 15 STRL LF DISP TIS (BLADE) ×2 IMPLANT
BNDG COMPR ESMARK 4X3 LF (GAUZE/BANDAGES/DRESSINGS) IMPLANT
BNDG ELASTIC 3INX 5YD STR LF (GAUZE/BANDAGES/DRESSINGS) ×1 IMPLANT
BNDG GAUZE DERMACEA FLUFF 4 (GAUZE/BANDAGES/DRESSINGS) ×1 IMPLANT
CHLORAPREP W/TINT 26 (MISCELLANEOUS) ×1 IMPLANT
CORD BIPOLAR FORCEPS 12FT (ELECTRODE) ×1 IMPLANT
COVER BACK TABLE 60X90IN (DRAPES) ×1 IMPLANT
COVER MAYO STAND STRL (DRAPES) ×1 IMPLANT
CUFF TOURN SGL QUICK 18X4 (TOURNIQUET CUFF) ×1 IMPLANT
DRAPE EXTREMITY T 121X128X90 (DISPOSABLE) ×1 IMPLANT
DRAPE SURG 17X23 STRL (DRAPES) ×1 IMPLANT
GAUZE PAD ABD 8X10 STRL (GAUZE/BANDAGES/DRESSINGS) ×1 IMPLANT
GAUZE SPONGE 4X4 12PLY STRL (GAUZE/BANDAGES/DRESSINGS) ×1 IMPLANT
GAUZE XEROFORM 1X8 LF (GAUZE/BANDAGES/DRESSINGS) ×1 IMPLANT
GLOVE BIO SURGEON STRL SZ7.5 (GLOVE) ×1 IMPLANT
GLOVE BIOGEL PI IND STRL 8 (GLOVE) ×1 IMPLANT
GOWN STRL REUS W/ TWL LRG LVL3 (GOWN DISPOSABLE) ×1 IMPLANT
GOWN STRL REUS W/TWL XL LVL3 (GOWN DISPOSABLE) ×1 IMPLANT
NEEDLE HYPO 25X1 1.5 SAFETY (NEEDLE) ×1 IMPLANT
PACK BASIN DAY SURGERY FS (CUSTOM PROCEDURE TRAY) ×1 IMPLANT
PADDING CAST ABS COTTON 4X4 ST (CAST SUPPLIES) ×1 IMPLANT
SOLN 0.9% NACL POUR BTL 1000ML (IV SOLUTION) ×1 IMPLANT
STOCKINETTE 4X48 STRL (DRAPES) ×1 IMPLANT
SUT ETHILON 4 0 PS 2 18 (SUTURE) ×1 IMPLANT
SYR BULB EAR ULCER 3OZ GRN STR (SYRINGE) ×1 IMPLANT
SYR CONTROL 10ML LL (SYRINGE) ×1 IMPLANT
TOWEL GREEN STERILE FF (TOWEL DISPOSABLE) ×2 IMPLANT
UNDERPAD 30X36 HEAVY ABSORB (UNDERPADS AND DIAPERS) ×1 IMPLANT

## 2024-01-11 NOTE — Transfer of Care (Signed)
 Immediate Anesthesia Transfer of Care Note  Patient: Heidi Mckee  Procedure(s) Performed: CARPAL TUNNEL RELEASE (Right: Wrist)  Patient Location: PACU  Anesthesia Type:General  Level of Consciousness: awake, alert , and oriented  Airway & Oxygen Therapy: Patient Spontanous Breathing and Patient connected to face mask oxygen  Post-op Assessment: Report given to RN and Post -op Vital signs reviewed and stable  Post vital signs: Reviewed and stable  Last Vitals:  Vitals Value Taken Time  BP    Temp    Pulse 61 01/11/24 13:51  Resp 11 01/11/24 13:51  SpO2 99 % 01/11/24 13:51  Vitals shown include unfiled device data.  Last Pain:  Vitals:   01/11/24 1128  TempSrc: Temporal  PainSc: 0-No pain         Complications: No notable events documented.

## 2024-01-11 NOTE — H&P (Addendum)
 Heidi Mckee is an 69 y.o. female.   Chief Complaint: Right carpal tunnel syndrome HPI: Heidi Mckee is a 69 year old female that presents with right hand numbness and tingling right carpal tunnel syndrome that has been going on for decades. Symptoms are worse when she drives, she is dropping things, and has issues with dexterity. She has nocturnal symptoms that awaken her. She had in injeciton in October 2025 which provided temporary relief, but her symptoms returned.   Allergies: Allergies[1]  Past Medical History:  Diagnosis Date   Allergy @ 1990   swelling, hives   Arthritis @2018    very little   Bleeds easily    per patient 11/20/2020 Pt states that when she was younger she had frequent nosebleeds. She states that it takes longer for her blood to clot. She did require a transfusion after a CSection but doesn't know why. She states that it has gotten better as she has gotten older. She states that she has never been diagnosed with any disorder.   Blood transfusion without reported diagnosis 1986   Carpal tunnel syndrome    Carpal tunnel syndrome of right wrist 05/12/2017   Heart murmur    MVP 34 yrs ago   HOH (hard of hearing) 11/20/2020   Patient has bilateral hearing aids.   Hx of migraines    11/20/20 pt states that she had migraines in her teens and 20's   Mitral valve prolapse    UTI (urinary tract infection)     Past Surgical History:  Procedure Laterality Date   CESAREAN SECTION  05-11-1984   emergency c section   COLONOSCOPY     10 yrs ago   COLONOSCOPY  10/19/2019   DILATATION & CURETTAGE/HYSTEROSCOPY WITH MYOSURE N/A 11/23/2020   Procedure: DILATATION & CURETTAGE/HYSTEROSCOPY WITH MYOSURE;  Surgeon: Curlene Agent, MD;  Location: [facility];  Service: Gynecology;  Laterality: N/A;   EYE SURGERY  mar apr 2024   cataract    Family History: Family History  Problem Relation Age of Onset   Dementia Mother    Hypertension Mother    Miscarriages /  Stillbirths Mother    Stroke Mother    Vision loss Mother    Stroke Father    Early death Father    Heart disease Father    Obesity Father    Cancer Maternal Grandfather    Stroke Maternal Grandmother    Varicose Veins Maternal Grandmother    Stroke Paternal Grandfather    Arthritis Paternal Grandmother    Diabetes Paternal Grandmother    Obesity Paternal Grandmother    Diabetes Maternal Uncle    ADD / ADHD Daughter    Arthritis Maternal Aunt    Diabetes Maternal Aunt    Vision loss Maternal Aunt    Diabetes Paternal Aunt    Obesity Paternal Aunt    Colon cancer Neg Hx    Colon polyps Neg Hx    Esophageal cancer Neg Hx    Rectal cancer Neg Hx    Stomach cancer Neg Hx     Social History:   reports that she quit smoking about 8 years ago. Her smoking use included cigarettes. She started smoking about 48 years ago. She has a 20 pack-year smoking history. She has never used smokeless tobacco. She reports current alcohol use. She reports that she does not use drugs.  Medications: Medications Prior to Admission  Medication Sig Dispense Refill   Cholecalciferol 25 MCG (1000 UT) tablet Vitamin D3 25  mcg (1,000 unit) tablet   1 tablet every day by oral route.     gabapentin  (NEURONTIN ) 100 MG capsule Take 1 capsule (100 mg total) by mouth 3 (three) times daily. 90 capsule 6   Magnesium 200 MG TABS 1 tablet daily     Multiple Vitamin (MULTIVITAMIN WITH MINERALS) TABS tablet Take 1 tablet by mouth daily.     omega-3 fish oil (MAXEPA) 1000 MG CAPS capsule 2 capsules.     pantoprazole  (PROTONIX ) 40 MG tablet TAKE 1 TABLET BY MOUTH DAILY 90 tablet 3   methocarbamol  (ROBAXIN ) 500 MG tablet Take 1 tablet (500 mg total) by mouth every 8 (eight) hours as needed for muscle spasms. 30 tablet 1   traMADol  (ULTRAM ) 50 MG tablet Take 1 tablet (50 mg total) by mouth every 8 (eight) hours as needed. 30 tablet 0    No results found for this or any previous visit (from the past 48 hours).  No  results found.    Blood pressure 107/78, pulse 74, temperature (!) 97 F (36.1 C), temperature source Temporal, resp. rate 16, height 5' 5 (1.651 m), weight 94.4 kg, SpO2 100%.  General appearance: alert and cooperative Head: Normocephalic, without obvious abnormality, atraumatic Neck: supple, symmetrical, trachea midline Extremities: Positive Tinel's, Phalen's, Durkan's tests at the right wrist. No thenar atrophy. Brisk capillar refill. Good skin turgor.   Skin: Skin color, texture, turgor normal. No rashes or lesions Neurologic: Grossly normal Incision/Wound: none  Assessment/Plan Right carpal tunnel syndrome  - Plan is to take her to the OR today for a right carpal tunnel  release. It is appropriate for her to have this done at an ambulatory surgery center.   Allison R Cosentino 01/11/2024, 12:43 PM   Addendum (01/11/23): Seen and examined.  Agree with above.  Plan for carpal tunnel release in OR.  Non operative and operative treatment options have been discussed with the patient and patient wishes to proceed with operative treatment. Risks, benefits, and alternatives of surgery have been discussed and the patient agrees with the plan of care.     [1]  Allergies Allergen Reactions   Sulfa Antibiotics Anaphylaxis   Penicillins     Unknown reaction - was told d/t reaction sulfa drugs

## 2024-01-11 NOTE — Op Note (Signed)
 SURGERY ASSISTANT NOTE  PLACE OF SERVICE: [facility]   PATIENT INFORMATION: Name: Heidi Mckee MRN#: [id number] DOB: 10/25/1955  Date of surgery: 01/11/2024 Time of surgery: 1:41 PM  SURGERY ASSISTANT NOTE:  Assistant name: Isaiah Anton, PA-C Note date: 01/11/2024  I assisted Dr. Franky Curia on the following procedure(s) for the above-noted patient in the date and time documented:   Right carpal tunnel release   I provided assistance on the case as follows:  Assistance with exposure, retraction, bleeding control, protection of vital structures, instrumentation  and closure.   Isaiah Anton, PA-C

## 2024-01-11 NOTE — Anesthesia Procedure Notes (Signed)
 Procedure Name: LMA Insertion Date/Time: 01/11/2024 1:11 PM  Performed by: Buster Catheryn SAUNDERS, CRNAPre-anesthesia Checklist: Patient identified, Emergency Drugs available, Suction available and Patient being monitored Patient Re-evaluated:Patient Re-evaluated prior to induction Oxygen Delivery Method: Circle system utilized Preoxygenation: Pre-oxygenation with 100% oxygen Induction Type: IV induction Ventilation: Mask ventilation without difficulty LMA: LMA inserted LMA Size: 4.0 Number of attempts: 1 Placement Confirmation: positive ETCO2 Tube secured with: Tape Dental Injury: Teeth and Oropharynx as per pre-operative assessment

## 2024-01-11 NOTE — Discharge Instructions (Addendum)
 [facility] Instructions Hand Surgery  Wound Care: Keep your hand elevated above the level of your heart.  Do not allow it to dangle by your side.  Keep the dressing dry and do not remove it unless your doctor advises you to do so.  He will usually change it at the time of your post-op visit.  Moving your fingers is advised to stimulate circulation but will depend on the site of your surgery.  If you have a splint applied, your doctor will advise you regarding movement.  Activity: Do not drive or operate machinery today.  Rest today and then you may return to your normal activity and work as indicated by your physician.  Diet:  Drink liquids today or eat a light diet.  You may resume a regular diet tomorrow.    General expectations: Pain for two to three days. Fingers may become slightly swollen.  Call your doctor if any of the following occur: Severe pain not relieved by pain medication. Elevated temperature. Dressing soaked with blood. Inability to move fingers. White or bluish color to fingers.    Post Anesthesia Home Care Instructions  Activity: Get plenty of rest for the remainder of the day. A responsible individual must stay with you for 24 hours following the procedure.  For the next 24 hours, DO NOT: -Drive a car -Advertising copywriter -Drink alcoholic beverages -Take any medication unless instructed by your physician -Make any legal decisions or sign important papers.  Meals: Start with liquid foods such as gelatin or soup. Progress to regular foods as tolerated. Avoid greasy, spicy, heavy foods. If nausea and/or vomiting occur, drink only clear liquids until the nausea and/or vomiting subsides. Call your physician if vomiting continues.  Special Instructions/Symptoms: Your throat may feel dry or sore from the anesthesia or the breathing tube placed in your throat during surgery. If this causes discomfort, gargle with warm salt water. The discomfort should disappear  within 24 hours.  If you had a scopolamine patch placed behind your ear for the management of post- operative nausea and/or vomiting:  1. The medication in the patch is effective for 72 hours, after which it should be removed.  Wrap patch in a tissue and discard in the trash. Wash hands thoroughly with soap and water. 2. You may remove the patch earlier than 72 hours if you experience unpleasant side effects which may include dry mouth, dizziness or visual disturbances. 3. Avoid touching the patch. Wash your hands with soap and water after contact with the patch.    No tylenol  until after 8:30pm tonight

## 2024-01-11 NOTE — Anesthesia Preprocedure Evaluation (Addendum)
"                                    Anesthesia Evaluation  Patient identified by MRN, date of birth, ID band Patient awake    Reviewed: Allergy & Precautions, NPO status , Patient's Chart, lab work & pertinent test results  History of Anesthesia Complications Negative for: history of anesthetic complications  Airway Mallampati: I  TM Distance: >3 FB Neck ROM: Full    Dental  (+) Teeth Intact, Dental Advisory Given   Pulmonary former smoker   breath sounds clear to auscultation       Cardiovascular hypertension, + Valvular Problems/Murmurs MVP  Rhythm:Regular Rate:Normal     Neuro/Psych    GI/Hepatic ,GERD  Medicated and Controlled,,  Endo/Other    Renal/GU      Musculoskeletal   Abdominal   Peds  Hematology   Anesthesia Other Findings   Reproductive/Obstetrics                              Anesthesia Physical Anesthesia Plan  ASA: 2  Anesthesia Plan: General   Post-op Pain Management:    Induction: Intravenous  PONV Risk Score and Plan: 2 and Ondansetron , Dexamethasone  and Treatment may vary due to age or medical condition  Airway Management Planned: LMA  Additional Equipment: None  Intra-op Plan:   Post-operative Plan: Extubation in OR  Informed Consent: I have reviewed the patients History and Physical, chart, labs and discussed the procedure including the risks, benefits and alternatives for the proposed anesthesia with the patient or authorized representative who has indicated his/her understanding and acceptance.     Dental advisory given  Plan Discussed with: CRNA  Anesthesia Plan Comments:          Anesthesia Quick Evaluation  "

## 2024-01-11 NOTE — Anesthesia Postprocedure Evaluation (Signed)
"   Anesthesia Post Note  Patient: Heidi Mckee  Procedure(s) Performed: CARPAL TUNNEL RELEASE (Right: Wrist)     Patient location during evaluation: PACU Anesthesia Type: General Level of consciousness: awake Pain management: pain level controlled Vital Signs Assessment: post-procedure vital signs reviewed and stable Respiratory status: spontaneous breathing Cardiovascular status: blood pressure returned to baseline Postop Assessment: no apparent nausea or vomiting and adequate PO intake Anesthetic complications: no   No notable events documented.  Last Vitals:  Vitals:   01/11/24 1415 01/11/24 1449  BP: 130/68 (!) 149/74  Pulse: (!) 55 62  Resp: 11 20  Temp:  (!) 36.2 C  SpO2: 98% 94%    Last Pain:  Vitals:   01/11/24 1449  TempSrc: Tympanic  PainSc: 0-No pain                 Darshan Solanki T Colhoun      "

## 2024-01-11 NOTE — Op Note (Signed)
 01/11/2024 [facility]                              OPERATIVE REPORT   PREOPERATIVE DIAGNOSIS:  Right carpal tunnel syndrome  POSTOPERATIVE DIAGNOSIS:  Right carpal tunnel syndrome  PROCEDURE:  Right carpal tunnel release  SURGEON:  Franky Curia, MD  ASSISTANT:  Isaiah Anton, [facility]  ANESTHESIA: General  IV FLUIDS:  Per anesthesia flow sheet  ESTIMATED BLOOD LOSS:  Minimal  COMPLICATIONS:  None  SPECIMENS:  None  TOURNIQUET TIME:   Total Tourniquet Time Documented: Upper Arm (Right) - 11 minutes Total: Upper Arm (Right) - 11 minutes   DISPOSITION:  Stable to PACU  LOCATION: [facility]  INDICATIONS:  69 y.o. yo female with numbness and tingling right hand.  Nocturnal symptoms. Positive nerve conduction studies. She wishes to proceed with right carpal tunnel release.  Risks, benefits and alternatives of surgery were discussed including the risk of blood loss; infection; damage to nerves, vessels, tendons, ligaments, bone; failure of surgery; need for additional surgery; complications with wound healing; continued pain; recurrence of carpal tunnel syndrome; and damage to motor branch. She voiced understanding of these risks and elected to proceed.   OPERATIVE COURSE:  After being identified preoperatively by myself, the patient and I agreed upon the procedure and site of procedure.  The surgical site was marked.  Surgical consent had been signed.  She was given IV Ancef  as preoperative antibiotic prophylaxis.  She was transferred to the operating room and placed on the operating room table in supine position with the Right upper extremity on an armboard.  General anesthesia was induced by the anesthesiologist.  Right upper extremity was prepped and draped in normal sterile orthopaedic fashion.  A surgical pause was performed between the surgeons, anesthesia, and operating room staff, and all were in agreement as to the patient, procedure, and site of  procedure.  Tourniquet at the proximal aspect of the extremity was inflated to 250 mmHg after exsanguination of the arm with an Esmarch bandage  Incision was made over the transverse carpal ligament and carried into the subcutaneous tissues by spreading technique.  Bipolar electrocautery was used to obtain hemostasis.  The palmar fascia was sharply incised.  The transverse carpal ligament was identified.  The fascia distal to the ligament was opened.  Retractor was placed and the flexor tendons were identified.  The flexor tendon to the ring finger was identified and retracted radially.  The transverse carpal ligament was then incised from distal to proximal under direct visualization.  Scissors were used to split the distal aspect of the volar antebrachial fascia.  A finger was placed into the wound to ensure complete decompression, which was the case.  The nerve was examined.  It was flattened and hyperemic.  The motor branch was identified and was intact.  The wound was copiously irrigated with sterile saline.  It was then closed with 4-0 nylon in a horizontal mattress fashion.  It was injected with 0.25% plain Marcaine  to aid in postoperative analgesia.  It was dressed with sterile Xeroform, 4x4s, an ABD, and wrapped with Kerlix and an Ace bandage.  Tourniquet was deflated at 11 minutes.  Fingertips were pink with brisk capillary refill after deflation of the tourniquet.  Operative drapes were broken down.  The patient was awoken from anesthesia safely.  She was transferred back to stretcher and taken to the PACU in stable condition.  I will see her back in the office in 1 week for postoperative followup.  I will give her a prescription for Percocet 5/325 1 tab PO q6 hours prn pain, dispense #15.    Heidi Schuff, MD Electronically signed, 01/11/2024

## 2024-01-12 ENCOUNTER — Encounter (HOSPITAL_BASED_OUTPATIENT_CLINIC_OR_DEPARTMENT_OTHER): Payer: Self-pay | Admitting: Orthopedic Surgery

## 2024-06-24 ENCOUNTER — Ambulatory Visit

## 2024-11-28 ENCOUNTER — Encounter: Admitting: Family Medicine
